# Patient Record
Sex: Female | Born: 1985 | Race: Black or African American | Hispanic: No | Marital: Single | Smoking: Former smoker
Health system: Southern US, Community
[De-identification: ages and names within clinical notes are randomized; demographics above are authoritative.]

## PROBLEM LIST (undated history)

## (undated) ENCOUNTER — Inpatient Hospital Stay (HOSPITAL_COMMUNITY): Payer: Self-pay

## (undated) DIAGNOSIS — Z789 Other specified health status: Secondary | ICD-10-CM

## (undated) HISTORY — PX: UMBILICAL HERNIA REPAIR: SHX196

## (undated) HISTORY — PX: I & D EXTREMITY: SHX5045

---
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
## 2005-01-14 ENCOUNTER — Ambulatory Visit: Payer: Self-pay | Admitting: Family Medicine

## 2005-03-28 ENCOUNTER — Encounter (INDEPENDENT_AMBULATORY_CARE_PROVIDER_SITE_OTHER): Payer: Self-pay | Admitting: *Deleted

## 2005-04-12 ENCOUNTER — Ambulatory Visit: Payer: Self-pay | Admitting: Sports Medicine

## 2005-04-16 ENCOUNTER — Encounter: Admission: RE | Admit: 2005-04-16 | Discharge: 2005-04-16 | Payer: Self-pay | Admitting: Sports Medicine

## 2005-05-01 ENCOUNTER — Emergency Department (HOSPITAL_COMMUNITY): Admission: EM | Admit: 2005-05-01 | Discharge: 2005-05-01 | Payer: Self-pay | Admitting: *Deleted

## 2005-05-17 ENCOUNTER — Ambulatory Visit: Payer: Self-pay | Admitting: Family Medicine

## 2005-07-01 ENCOUNTER — Emergency Department (HOSPITAL_COMMUNITY): Admission: EM | Admit: 2005-07-01 | Discharge: 2005-07-01 | Payer: Self-pay | Admitting: Emergency Medicine

## 2005-07-19 ENCOUNTER — Ambulatory Visit: Payer: Self-pay | Admitting: Family Medicine

## 2005-07-20 ENCOUNTER — Emergency Department (HOSPITAL_COMMUNITY): Admission: EM | Admit: 2005-07-20 | Discharge: 2005-07-20 | Payer: Self-pay | Admitting: Emergency Medicine

## 2005-10-16 ENCOUNTER — Ambulatory Visit: Payer: Self-pay | Admitting: Family Medicine

## 2005-10-31 ENCOUNTER — Ambulatory Visit: Payer: Self-pay | Admitting: Family Medicine

## 2005-12-02 ENCOUNTER — Emergency Department (HOSPITAL_COMMUNITY): Admission: EM | Admit: 2005-12-02 | Discharge: 2005-12-03 | Payer: Self-pay | Admitting: Emergency Medicine

## 2006-02-25 ENCOUNTER — Ambulatory Visit: Payer: Self-pay

## 2006-03-27 DIAGNOSIS — F339 Major depressive disorder, recurrent, unspecified: Secondary | ICD-10-CM | POA: Insufficient documentation

## 2006-03-28 ENCOUNTER — Encounter (INDEPENDENT_AMBULATORY_CARE_PROVIDER_SITE_OTHER): Payer: Self-pay | Admitting: *Deleted

## 2006-05-14 ENCOUNTER — Ambulatory Visit: Payer: Self-pay | Admitting: Family Medicine

## 2006-05-16 ENCOUNTER — Ambulatory Visit: Payer: Self-pay | Admitting: Family Medicine

## 2006-05-27 ENCOUNTER — Encounter (INDEPENDENT_AMBULATORY_CARE_PROVIDER_SITE_OTHER): Payer: Self-pay | Admitting: Family Medicine

## 2006-05-27 ENCOUNTER — Other Ambulatory Visit: Admission: RE | Admit: 2006-05-27 | Discharge: 2006-05-27 | Payer: Self-pay | Admitting: Family Medicine

## 2006-05-27 ENCOUNTER — Ambulatory Visit: Payer: Self-pay | Admitting: Family Medicine

## 2006-05-27 LAB — CONVERTED CEMR LAB: Chlamydia, DNA Probe: NEGATIVE

## 2006-05-28 ENCOUNTER — Encounter (INDEPENDENT_AMBULATORY_CARE_PROVIDER_SITE_OTHER): Payer: Self-pay | Admitting: Family Medicine

## 2006-06-02 ENCOUNTER — Encounter (INDEPENDENT_AMBULATORY_CARE_PROVIDER_SITE_OTHER): Payer: Self-pay | Admitting: Family Medicine

## 2006-06-11 ENCOUNTER — Encounter (INDEPENDENT_AMBULATORY_CARE_PROVIDER_SITE_OTHER): Payer: Self-pay | Admitting: Family Medicine

## 2006-06-11 LAB — CONVERTED CEMR LAB: Pap Smear: NORMAL

## 2006-10-10 ENCOUNTER — Ambulatory Visit: Payer: Self-pay | Admitting: Family Medicine

## 2006-10-23 ENCOUNTER — Ambulatory Visit: Payer: Self-pay | Admitting: Sports Medicine

## 2006-10-23 LAB — CONVERTED CEMR LAB: Beta hcg, urine, semiquantitative: NEGATIVE

## 2006-12-02 ENCOUNTER — Encounter: Payer: Self-pay | Admitting: *Deleted

## 2006-12-19 ENCOUNTER — Ambulatory Visit: Payer: Self-pay | Admitting: Family Medicine

## 2006-12-19 ENCOUNTER — Encounter (INDEPENDENT_AMBULATORY_CARE_PROVIDER_SITE_OTHER): Payer: Self-pay | Admitting: Family Medicine

## 2006-12-19 LAB — CONVERTED CEMR LAB
Cholesterol: 99 mg/dL (ref 0–200)
Triglycerides: 50 mg/dL (ref <150)
VLDL: 10 mg/dL (ref 0–40)

## 2007-01-02 ENCOUNTER — Encounter (INDEPENDENT_AMBULATORY_CARE_PROVIDER_SITE_OTHER): Payer: Self-pay | Admitting: Family Medicine

## 2007-01-15 ENCOUNTER — Ambulatory Visit: Payer: Self-pay | Admitting: Family Medicine

## 2007-01-15 LAB — CONVERTED CEMR LAB: Beta hcg, urine, semiquantitative: NEGATIVE

## 2007-01-30 ENCOUNTER — Ambulatory Visit: Payer: Self-pay | Admitting: Family Medicine

## 2007-02-06 ENCOUNTER — Encounter (INDEPENDENT_AMBULATORY_CARE_PROVIDER_SITE_OTHER): Payer: Self-pay | Admitting: Family Medicine

## 2007-02-06 ENCOUNTER — Ambulatory Visit: Payer: Self-pay | Admitting: Family Medicine

## 2007-02-06 LAB — CONVERTED CEMR LAB
GC Probe Amp, Genital: NEGATIVE
TSH: 1.282 microintl units/mL (ref 0.350–5.50)

## 2007-02-09 ENCOUNTER — Encounter (INDEPENDENT_AMBULATORY_CARE_PROVIDER_SITE_OTHER): Payer: Self-pay | Admitting: Family Medicine

## 2007-02-11 ENCOUNTER — Encounter (INDEPENDENT_AMBULATORY_CARE_PROVIDER_SITE_OTHER): Payer: Self-pay | Admitting: Family Medicine

## 2007-02-16 ENCOUNTER — Ambulatory Visit: Payer: Self-pay | Admitting: Family Medicine

## 2007-04-17 ENCOUNTER — Ambulatory Visit: Payer: Self-pay | Admitting: Family Medicine

## 2007-05-19 ENCOUNTER — Ambulatory Visit: Payer: Self-pay | Admitting: Family Medicine

## 2007-06-01 ENCOUNTER — Telehealth: Payer: Self-pay | Admitting: *Deleted

## 2007-07-16 ENCOUNTER — Ambulatory Visit: Payer: Self-pay | Admitting: Family Medicine

## 2007-09-24 ENCOUNTER — Ambulatory Visit: Payer: Self-pay | Admitting: Family Medicine

## 2007-09-29 ENCOUNTER — Ambulatory Visit: Payer: Self-pay | Admitting: Family Medicine

## 2007-10-01 ENCOUNTER — Ambulatory Visit: Payer: Self-pay | Admitting: Family Medicine

## 2007-12-14 ENCOUNTER — Telehealth (INDEPENDENT_AMBULATORY_CARE_PROVIDER_SITE_OTHER): Payer: Self-pay | Admitting: *Deleted

## 2008-01-11 ENCOUNTER — Telehealth: Payer: Self-pay | Admitting: *Deleted

## 2008-01-13 ENCOUNTER — Ambulatory Visit: Payer: Self-pay | Admitting: Family Medicine

## 2008-01-13 LAB — CONVERTED CEMR LAB: Beta hcg, urine, semiquantitative: NEGATIVE

## 2008-03-25 ENCOUNTER — Telehealth (INDEPENDENT_AMBULATORY_CARE_PROVIDER_SITE_OTHER): Payer: Self-pay | Admitting: *Deleted

## 2008-04-08 ENCOUNTER — Ambulatory Visit: Payer: Self-pay | Admitting: Family Medicine

## 2008-06-24 ENCOUNTER — Ambulatory Visit: Payer: Self-pay | Admitting: Family Medicine

## 2008-09-22 ENCOUNTER — Ambulatory Visit: Payer: Self-pay | Admitting: Family Medicine

## 2008-11-01 ENCOUNTER — Emergency Department (HOSPITAL_COMMUNITY): Admission: EM | Admit: 2008-11-01 | Discharge: 2008-11-02 | Payer: Self-pay | Admitting: Emergency Medicine

## 2008-11-18 ENCOUNTER — Encounter: Payer: Self-pay | Admitting: Family Medicine

## 2008-11-18 ENCOUNTER — Ambulatory Visit: Payer: Self-pay | Admitting: Family Medicine

## 2008-11-18 LAB — CONVERTED CEMR LAB
Chlamydia, DNA Probe: NEGATIVE
GC Probe Amp, Genital: NEGATIVE

## 2008-11-22 ENCOUNTER — Encounter: Payer: Self-pay | Admitting: Family Medicine

## 2008-12-10 ENCOUNTER — Encounter (INDEPENDENT_AMBULATORY_CARE_PROVIDER_SITE_OTHER): Payer: Self-pay | Admitting: *Deleted

## 2008-12-10 DIAGNOSIS — F17200 Nicotine dependence, unspecified, uncomplicated: Secondary | ICD-10-CM

## 2008-12-26 ENCOUNTER — Ambulatory Visit: Payer: Self-pay | Admitting: Family Medicine

## 2008-12-26 LAB — CONVERTED CEMR LAB: Beta hcg, urine, semiquantitative: NEGATIVE

## 2009-01-09 ENCOUNTER — Telehealth: Payer: Self-pay | Admitting: Family Medicine

## 2009-01-10 ENCOUNTER — Encounter: Payer: Self-pay | Admitting: Family Medicine

## 2009-01-18 ENCOUNTER — Encounter: Payer: Self-pay | Admitting: Family Medicine

## 2009-03-21 ENCOUNTER — Emergency Department (HOSPITAL_COMMUNITY): Admission: EM | Admit: 2009-03-21 | Discharge: 2009-03-21 | Payer: Self-pay | Admitting: Emergency Medicine

## 2009-04-07 ENCOUNTER — Ambulatory Visit: Payer: Self-pay | Admitting: Family Medicine

## 2009-04-07 LAB — CONVERTED CEMR LAB: Beta hcg, urine, semiquantitative: NEGATIVE

## 2009-06-01 ENCOUNTER — Ambulatory Visit (HOSPITAL_COMMUNITY): Admission: RE | Admit: 2009-06-01 | Discharge: 2009-06-01 | Payer: Self-pay | Admitting: Family Medicine

## 2009-06-01 ENCOUNTER — Ambulatory Visit: Payer: Self-pay | Admitting: Family Medicine

## 2009-06-01 ENCOUNTER — Encounter: Payer: Self-pay | Admitting: Family Medicine

## 2009-06-01 LAB — CONVERTED CEMR LAB
Basophils Absolute: 0 10*3/uL (ref 0.0–0.1)
Eosinophils Absolute: 0.2 10*3/uL (ref 0.0–0.7)
Lymphs Abs: 2.9 10*3/uL (ref 0.7–4.0)
MCV: 85.2 fL (ref 78.0–100.0)
Neutro Abs: 4.8 10*3/uL (ref 1.7–7.7)
Neutrophils Relative %: 57 % (ref 43–77)

## 2009-06-02 ENCOUNTER — Encounter: Payer: Self-pay | Admitting: Family Medicine

## 2009-06-08 ENCOUNTER — Telehealth: Payer: Self-pay | Admitting: *Deleted

## 2009-10-31 ENCOUNTER — Ambulatory Visit: Payer: Self-pay | Admitting: Family Medicine

## 2009-10-31 LAB — CONVERTED CEMR LAB: Beta hcg, urine, semiquantitative: NEGATIVE

## 2009-11-10 ENCOUNTER — Encounter: Payer: Self-pay | Admitting: Family Medicine

## 2009-11-10 ENCOUNTER — Ambulatory Visit: Payer: Self-pay | Admitting: Family Medicine

## 2009-11-10 LAB — CONVERTED CEMR LAB
GC Probe Amp, Genital: NEGATIVE
Whiff Test: NEGATIVE

## 2009-11-29 ENCOUNTER — Emergency Department (HOSPITAL_COMMUNITY)
Admission: EM | Admit: 2009-11-29 | Discharge: 2009-11-29 | Payer: Self-pay | Source: Home / Self Care | Admitting: Emergency Medicine

## 2009-11-30 ENCOUNTER — Telehealth (INDEPENDENT_AMBULATORY_CARE_PROVIDER_SITE_OTHER): Payer: Self-pay | Admitting: *Deleted

## 2009-12-01 ENCOUNTER — Ambulatory Visit: Payer: Self-pay | Admitting: Family Medicine

## 2009-12-01 DIAGNOSIS — R091 Pleurisy: Secondary | ICD-10-CM | POA: Insufficient documentation

## 2009-12-06 ENCOUNTER — Ambulatory Visit: Payer: Self-pay | Admitting: Family Medicine

## 2009-12-06 DIAGNOSIS — R918 Other nonspecific abnormal finding of lung field: Secondary | ICD-10-CM

## 2009-12-08 ENCOUNTER — Ambulatory Visit: Payer: Self-pay | Admitting: Family Medicine

## 2009-12-29 ENCOUNTER — Encounter (INDEPENDENT_AMBULATORY_CARE_PROVIDER_SITE_OTHER): Payer: Self-pay | Admitting: *Deleted

## 2010-01-25 ENCOUNTER — Ambulatory Visit: Admission: RE | Admit: 2010-01-25 | Discharge: 2010-01-25 | Payer: Self-pay | Source: Home / Self Care
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
## 2010-03-01 NOTE — Progress Notes (Signed)
Summary: x-ray order/TS  Phone Note Outgoing Call Call back at [facility] Phone [phone]   Summary of Call: called to discuss results of EKG and normal CBC/TSH.  pt states notes relationship between exertion and eating and chest pain sxs.  Advised I'd like to get CXR to eval heart pathology/hiatal hernia.  Concerning is family history.  Consider cards referral.  could first consider PPI vs NSAID. Initial call taken by: Eustaquio Boyden  MD,  Jun 08, 2009 4:45 PM  Follow-up for Phone Call        order for CXR up front for pick up. Follow-up by: Arlyss Repress CMA,,  Jun 08, 2009 5:28 PM

## 2010-03-01 NOTE — Assessment & Plan Note (Signed)
Summary: depo inj,tcb  Nurse Visit   Allergies: No Known Drug Allergies Laboratory Results   Urine Tests  Date/Time Received: April 07, 2009 2:48 PM  Date/Time Reported: April 07, 2009 2:52 PM     Urine HCG: negative Comments: ...........test performed by...........Marland KitchenTerese Door, CMA     Medication Administration  Injection # 1:    Medication: Depo-Provera 150mg     Diagnosis: CONTRACEPTIVE MANAGEMENT (ICD-V25.09)    Route: IM    Site: R deltoid    Exp Date: 05/2011    Lot #: [id number]    Mfr: [email or web address]    Comments: next depo due May 27 thru June 10    Patient tolerated injection without complications    Given by: Theresia Lo RN (April 07, 2009 3:13 PM)  Orders Added: 1)  U Preg-[facility] [81025] 2)  Depo-Provera 150mg  [J1055] 3)  Admin of Injection (IM/SQ) [82993]    Medication Administration  Injection # 1:    Medication: Depo-Provera 150mg     Diagnosis: CONTRACEPTIVE MANAGEMENT (ICD-V25.09)    Route: IM    Site: R deltoid    Exp Date: 05/2011    Lot #: [id number]    Mfr: [email or web address]    Comments: next depo due May 27 thru June 10    Patient tolerated injection without complications    Given by: Theresia Lo RN (April 07, 2009 3:13 PM)  Orders Added: 1)  U Preg-[facility] [81025] 2)  Depo-Provera 150mg  [J1055] 3)  Admin of Injection (IM/SQ) [89381]  patient instructed to use extra protection for next 7 days.  she reports no sexual relations in past 2 weeks. Theresia Lo RN  April 07, 2009 3:15 PM

## 2010-03-01 NOTE — Assessment & Plan Note (Signed)
Summary: depo/eo  Nurse Visit   Vitals Entered By: Arlyss Repress CMA, (January 25, 2010 10:05 AM)  Allergies: No Known Drug Allergies  Medication Administration  Injection # 1:    Medication: Depo-Provera 150mg     Diagnosis: CONTRACEPTIVE MANAGEMENT (ICD-V25.09)    Route: IM    Site: RUOQ gluteus    Exp Date: 05/28/2012    Lot #: [id number]    Mfr: [email or web address]    Comments: NEXT DEPO DUE 3-16 THROUGH 04-27-2010. REMINDER CARD GIVEN TO PT.    Patient tolerated injection without complications    Given by: Arlyss Repress CMA, (January 25, 2010 10:06 AM)  Orders Added: 1)  Depo-Provera 150mg  [J1055] 2)  Admin of Injection (IM/SQ) [62952]   Medication Administration  Injection # 1:    Medication: Depo-Provera 150mg     Diagnosis: CONTRACEPTIVE MANAGEMENT (ICD-V25.09)    Route: IM    Site: RUOQ gluteus    Exp Date: 05/28/2012    Lot #: [id number]    Mfr: [email or web address]    Comments: NEXT DEPO DUE 3-16 THROUGH 04-27-2010. REMINDER CARD GIVEN TO PT.    Patient tolerated injection without complications    Given by: Arlyss Repress CMA, (January 25, 2010 10:06 AM)  Orders Added: 1)  Depo-Provera 150mg  [J1055] 2)  Admin of Injection (IM/SQ) [40102]
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
## 2010-03-01 NOTE — Assessment & Plan Note (Signed)
Summary: f/u,df   Vital Signs:  Patient profile:   25 year old female Weight:      99.3 pounds Pulse rate:   74 / minute BP sitting:   112 / 74  (right arm)  Vitals Entered By: Renato Battles slade,cma CC: note for insurance, that she needs depo shot for heavy menses. c/o chest pain every few days. feels like someone sticks a pin into my chest. has been going on for over the last 6 months. Is Patient Diabetic? No Pain Assessment Patient in pain? no        Primary Care Provider:  Eustaquio Boyden  MD  CC:  note for insurance and that she needs depo shot for heavy menses. c/o chest pain every few days. feels like someone sticks a pin into my chest. has been going on for over the last 6 months..  History of Present Illness: CC: note for insurance  1. letter verifying depo is for menorrhagia.  Depo is only thing that helps.  Has been on depo for several years now.  Doesn't drink milk, no calcium supplements.  does eat cheese.  2. chest pain - 6 mo h/o sharp chest pains about once a month, happen slightly left sided.  Lasts seconds then goes away.  No relation to activity, or food.  h/o HLD.  Last week occurred when rushing and studying.  Increased sterss recently - school and Mom with lung cancer.  never tight pressure, never dull achey.  No radiation.  Hasn't tried meds for it.  Brother with h/o MI at age 37yo and 13yo.   Not reproducible.  Occasional palpitations, dizziness x 1 episode and dyspnea on exertion.  No leg swelling.  3. Smoker - 5 cig/day.  attributes to stress.    Habits & Providers  Alcohol-Tobacco-Diet     Tobacco Status: current     Tobacco Counseling: to quit use of tobacco products  Current Medications (verified): 1)  Depo-Provera 150 Mg/ml Susp (Medroxyprogesterone Acetate) .... Inject 1 Syringe Intramuscularly Single Dose 2)  Cyclobenzaprine Hcl 5 Mg Tabs (Cyclobenzaprine Hcl) .... Take One By Mouth Two Times A Day As Needed Spasm  Allergies (verified): No Known  Drug Allergies  Family History: 4 bros, 1 sis healthy;another brother had MI at age 58 and again at age 62.  mom with depression and lung CA; dad with HTN. grnadma died cancer.  Physical Exam  General:  Well-developed,well-nourished,in no acute distress; alert,appropriate and cooperative throughout examination.  thin Chest Wall:  No deformities, masses, or tenderness noted. Lungs:  Normal respiratory effort, chest expands symmetrically. Lungs are clear to auscultation, no crackles or wheezes. Heart:  Normal rate and regular rhythm. S1 and S2 normal without gallop, murmur, click, rub or other extra sounds. Pulses:  2+ periph pulses Extremities:  No clubbing, cyanosis, edema, or deformity noted with normal full range of motion of all joints.     Impression & Recommendations:  Problem # 1:  CHEST PAIN UNSPECIFIED (ICD-786.50) pt left before I could finish with her.  Would like her to return in 1-2 wks for f/u, would like to have cxr to further evaluate picture, eval for hiatal hernia.  check TSH and CBC.  concerning is family history of MI at early age although sxs don't sound cardiac in nature.  would try trial of PPI vs NSAID for msk pain.  EKG - normal to RAD, NSR, t wave inverted lead V2.   No WPW or arrhythmia.  Orders: 12 Lead EKG (12 Lead  EKG) [facility]- Est  Level 4 (99214) CBC w/Diff-[facility] (16109) TSH-[facility] (60454-09811)  Problem # 2:  TOBACCO USER (ICD-305.1) encouraged cessation Orders: 12 Lead EKG (12 Lead EKG) [facility]- Est  Level 4 (91478)  Problem # 3:  CONTRACEPTIVE MANAGEMENT (ICD-V25.09)  continue depo shot.  provided with letter.  Orders: [facility]- Est  Level 4 (99214)  Complete Medication List: 1)  Depo-provera 150 Mg/ml Susp (Medroxyprogesterone acetate) .... Inject 1 syringe intramuscularly single dose 2)  Cyclobenzaprine Hcl 5 Mg Tabs (Cyclobenzaprine hcl) .... Take one by mouth two times a day as needed spasm  Patient Instructions: 1)  Your EKG is looking good. 2)  I'm  unsure what is causing your chest pain.

## 2010-03-01 NOTE — Progress Notes (Signed)
Summary: triage  Phone Note Call from Patient Call back at Home Phone [phone]   Caller: Patient Summary of Call: pt is on depo and having abd pain and spotting pt has appt next Wed w/ Ashley Royalty Initial call taken by: De Nurse,  November 30, 2009 3:51 PM  Follow-up for Phone Call        spoke with patient and she states she developed chest pain yesterday and was taken to the ER. she was diagnosed with a lung infection she states.  she was also having abdominal pain at that time. she noticed some vaginal spotting today one time. she has ben on depo for a while . advised not uncommon for this this to occur . advised if bleeding should become heavy  we need to see sooner but otherwise she will follow up with MD at appointment next week. Follow-up by: Theresia Lo RN,  November 30, 2009 4:27 PM     Appended Document: triage patient calls back and decided she does want to be seen tomorrow. appointment scheduled tomorrow. AM at 8:30 AM.

## 2010-03-01 NOTE — Letter (Signed)
Summary: Generic Letter  [facility]  [address]   [place], [place] [address]   Phone: [phone]  Fax: [phone]    06/01/2009  [facility] [address] [place], [place]  [address]  Dear Ms. Odekirk,   To whom it may concern;  Meghan Frederick takes Depo provera for regulation of her menses.  This is clinically indicated.   Please contact my office if you have further questions.    Sincerely,   Eustaquio Boyden  MD

## 2010-03-01 NOTE — Assessment & Plan Note (Signed)
Summary: depo,df  Nurse Visit   Allergies: No Known Drug Allergies Laboratory Results   Urine Tests  Date/Time Received: October 31, 2009 9:06 AM  Date/Time Reported: October 31, 2009 9:11 AM     Urine HCG: negative Comments: ...............test performed by......Marland KitchenBonnie A. [place], MLS (ASCP)cm     Medication Administration  Injection # 1:    Medication: Depo-Provera 150mg     Diagnosis: CONTRACEPTIVE MANAGEMENT (ICD-V25.09)    Route: IM    Site: L deltoid    Exp Date: 03/2012    Lot #: [id number]    Mfr: [email or web address]    Comments: next depo due Dec 20 thru Jan 30, 2010    Patient tolerated injection without complications    Given by: Theresia Lo RN (October 31, 2009 9:58 AM)  Orders Added: 1)  U Preg-[facility] [81025] 2)  Depo-Provera 150mg  [J1055] 3)  Admin of Injection (IM/SQ) [53664]    Medication Administration  Injection # 1:    Medication: Depo-Provera 150mg     Diagnosis: CONTRACEPTIVE MANAGEMENT (ICD-V25.09)    Route: IM    Site: L deltoid    Exp Date: 03/2012    Lot #: [id number]    Mfr: [email or web address]    Comments: next depo due Dec 20 thru Jan 30, 2010    Patient tolerated injection without complications    Given by: Theresia Lo RN (October 31, 2009 9:58 AM)  Orders Added: 1)  U Preg-[facility] [81025] 2)  Depo-Provera 150mg  [J1055] 3)  Admin of Injection (IM/SQ) [25956]  patient reports no sexual activity  for more than 2 weeks . advised to use extra protection for next 7 days. she has appointment scheduled for CPE Nov 10, 2009. Theresia Lo RN  October 31, 2009 10:00 AM
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
## 2010-03-01 NOTE — Assessment & Plan Note (Signed)
Summary: read tb/eo  Nurse Visit   Allergies: No Known Drug Allergies  PPD Results    Date of reading: 12/08/2009    Results: 0 mm    Interpretation: negative  Orders Added: 1)  No Charge Patient Arrived ([id number]) [[id number]]

## 2010-03-01 NOTE — Miscellaneous (Signed)
  Clinical Lists Changes Medical Records Request Received medical records request Records went to [facility] Faxed on 12/06/2008 [facility]her  December 29, 2009 11:27 AM

## 2010-03-05 ENCOUNTER — Encounter: Payer: Self-pay | Admitting: *Deleted

## 2010-04-10 LAB — POCT PREGNANCY, URINE: Preg Test, Ur: NEGATIVE

## 2010-04-10 LAB — COMPREHENSIVE METABOLIC PANEL WITH GFR
ALT: 8 U/L (ref 0–35)
AST: 18 U/L (ref 0–37)
Albumin: 3.8 g/dL (ref 3.5–5.2)
Alkaline Phosphatase: 59 U/L (ref 39–117)
BUN: 6 mg/dL (ref 6–23)
CO2: 25 meq/L (ref 19–32)
Calcium: 9 mg/dL (ref 8.4–10.5)
Chloride: 106 meq/L (ref 96–112)
Creatinine, Ser: 0.56 mg/dL (ref 0.4–1.2)
GFR calc non Af Amer: >60 mL/min
Glucose, Bld: 95 mg/dL (ref 70–99)
Potassium: 3.7 meq/L (ref 3.5–5.1)
Sodium: 138 meq/L (ref 135–145)
Total Bilirubin: 0.6 mg/dL (ref 0.3–1.2)
Total Protein: 7.2 g/dL (ref 6.0–8.3)

## 2010-04-10 LAB — POCT CARDIAC MARKERS

## 2010-04-10 LAB — D-DIMER, QUANTITATIVE

## 2010-04-18 LAB — URINALYSIS, ROUTINE W REFLEX MICROSCOPIC
Hgb urine dipstick: NEGATIVE
Ketones, ur: NEGATIVE mg/dL
Nitrite: NEGATIVE
Protein, ur: NEGATIVE mg/dL
Urobilinogen, UA: 1 mg/dL (ref 0.0–1.0)

## 2010-04-18 LAB — URINE MICROSCOPIC-ADD ON

## 2010-05-18 ENCOUNTER — Ambulatory Visit (INDEPENDENT_AMBULATORY_CARE_PROVIDER_SITE_OTHER): Payer: Medicaid Other | Admitting: *Deleted

## 2010-05-18 DIAGNOSIS — Z309 Encounter for contraceptive management, unspecified: Secondary | ICD-10-CM

## 2010-05-18 DIAGNOSIS — Z3049 Encounter for surveillance of other contraceptives: Secondary | ICD-10-CM

## 2010-05-18 MED ORDER — MEDROXYPROGESTERONE ACETATE 150 MG/ML IM SUSP: 150.0000 mg | INTRAMUSCULAR | Status: DC

## 2010-05-18 MED ORDER — MEDROXYPROGESTERONE ACETATE 150 MG/ML IM SUSP
150.0000 mg | Freq: Once | INTRAMUSCULAR | Status: AC
  Administered 2010-05-18: 150 mg via INTRAMUSCULAR

## 2010-05-18 NOTE — Progress Notes (Signed)
Patient reports no sexual activity for greater than 2 weeks.  Depo given today and reminder card given to return  July 6 thru August 17, 2010

## 2010-07-30 ENCOUNTER — Ambulatory Visit (INDEPENDENT_AMBULATORY_CARE_PROVIDER_SITE_OTHER): Payer: Medicaid Other | Admitting: *Deleted

## 2010-07-30 DIAGNOSIS — Z309 Encounter for contraceptive management, unspecified: Secondary | ICD-10-CM

## 2010-07-30 NOTE — Progress Notes (Signed)
Patient arrived but is too early for depo. Advised to return 07/06 thru August 17, 2010 for next depo

## 2010-08-10 ENCOUNTER — Ambulatory Visit (INDEPENDENT_AMBULATORY_CARE_PROVIDER_SITE_OTHER): Payer: Medicaid Other | Admitting: *Deleted

## 2010-08-10 DIAGNOSIS — Z309 Encounter for contraceptive management, unspecified: Secondary | ICD-10-CM

## 2010-08-10 DIAGNOSIS — Z3049 Encounter for surveillance of other contraceptives: Secondary | ICD-10-CM

## 2010-08-10 MED ORDER — MEDROXYPROGESTERONE ACETATE 150 MG/ML IM SUSP
150.0000 mg | Freq: Once | INTRAMUSCULAR | Status: AC
  Administered 2010-08-10: 150 mg via INTRAMUSCULAR

## 2010-08-10 NOTE — Progress Notes (Deleted)
I

## 2010-10-29 ENCOUNTER — Ambulatory Visit (INDEPENDENT_AMBULATORY_CARE_PROVIDER_SITE_OTHER): Payer: Medicaid Other | Admitting: *Deleted

## 2010-10-29 DIAGNOSIS — Z309 Encounter for contraceptive management, unspecified: Secondary | ICD-10-CM

## 2010-10-29 DIAGNOSIS — Z3049 Encounter for surveillance of other contraceptives: Secondary | ICD-10-CM

## 2010-10-29 MED ORDER — MEDROXYPROGESTERONE ACETATE 150 MG/ML IM SUSP
150.0000 mg | Freq: Once | INTRAMUSCULAR | Status: AC
  Administered 2010-10-29: 150 mg via INTRAMUSCULAR

## 2010-11-16 ENCOUNTER — Encounter: Payer: Medicaid Other | Admitting: Family Medicine

## 2011-01-14 ENCOUNTER — Ambulatory Visit (INDEPENDENT_AMBULATORY_CARE_PROVIDER_SITE_OTHER): Payer: Medicaid Other | Admitting: Family Medicine

## 2011-01-14 ENCOUNTER — Encounter: Payer: Self-pay | Admitting: Family Medicine

## 2011-01-14 ENCOUNTER — Other Ambulatory Visit (HOSPITAL_COMMUNITY)
Admission: RE | Admit: 2011-01-14 | Discharge: 2011-01-14 | Disposition: A | Discharge status: Home / Self Care | Payer: Medicaid Other | Source: Ambulatory Visit | Attending: Family Medicine | Admitting: Family Medicine

## 2011-01-14 DIAGNOSIS — Z124 Encounter for screening for malignant neoplasm of cervix: Secondary | ICD-10-CM

## 2011-01-14 DIAGNOSIS — Z309 Encounter for contraceptive management, unspecified: Secondary | ICD-10-CM

## 2011-01-14 DIAGNOSIS — Z01419 Encounter for gynecological examination (general) (routine) without abnormal findings: Secondary | ICD-10-CM

## 2011-01-14 DIAGNOSIS — N760 Acute vaginitis: Secondary | ICD-10-CM

## 2011-01-14 DIAGNOSIS — IMO0001 Reserved for inherently not codable concepts without codable children: Secondary | ICD-10-CM

## 2011-01-14 LAB — POCT WET PREP (WET MOUNT)

## 2011-01-14 MED ORDER — MEDROXYPROGESTERONE ACETATE 150 MG/ML IM SUSP
150.0000 mg | Freq: Once | INTRAMUSCULAR | Status: AC
  Administered 2011-01-14: 150 mg via INTRAMUSCULAR

## 2011-01-14 NOTE — Patient Instructions (Signed)
It was nice seeing you today.   I will let you know the results of your tests when they return. Return in 3 months for repeat Depo Shot

## 2011-01-16 NOTE — Progress Notes (Signed)
Patient ID: Meghan Frederick, female   DOB: Apr 14, 1985, 25 y.o.   MRN: [id number] SUBJECTIVE:  25 y.o. female for annual routine Pap and checkup. Current Outpatient Prescriptions  Medication Sig Dispense Refill  . cyclobenzaprine (FLEXERIL) 5 MG tablet Take 5 mg by mouth 2 (two) times daily as needed. spasm       . medroxyPROGESTERone (DEPO-PROVERA) 150 MG/ML injection Inject 1 mL (150 mg total) into the muscle every 3 (three) months. Inject 1 syringe intramuscularly single dose  1 mL     Allergies: Review of patient's allergies indicates no known allergies.  Patient's last menstrual period was 12/24/2010., despite being on Depo.  She feels like depo was not administered correctly last time.  Denies any chance of being pregnant, abstinent x2 years  ROS:  Feeling well. No dyspnea or chest pain on exertion.  No abdominal pain, change in bowel habits, black or bloody stools.  No urinary tract symptoms. GYN ROS: she complains of vaginal discharge, clear in color without odor. No itching or burning.  No neurological complaints.  OBJECTIVE:  The patient appears well, alert, oriented x 3, in no distress. BP 108/70  Pulse 73  Temp(Src) 98.3 F (36.8 C) (Oral)  Ht 5\' 4"  (1.626 m)  Wt 96 lb (43.545 kg)  BMI 16.48 kg/m2  LMP 12/24/2010 ENT normal.  Neck supple. No adenopathy or thyromegaly. PERLA. Lungs are clear, good air entry, no wheezes, rhonchi or rales. S1 and S2 normal, no murmurs, regular rate and rhythm. Abdomen soft without tenderness, guarding, mass or organomegaly. Extremities show no edema, normal peripheral pulses. Neurological is normal, no focal findings.  PELVIC EXAM: normal external genitalia, vulva, vagina, cervix, uterus and adnexa, VULVA: normal appearing vulva with no masses, tenderness or lesions, VAGINA: normal appearing vagina with normal color and discharge, no lesions, CERVIX: normal appearing cervix without discharge or lesions, UTERUS: uterus is normal size, shape, consistency  and nontender, ADNEXA: normal adnexa in size, nontender and no masses, PAP: Pap smear done today, WET MOUNT done -  exam chaperoned by Arlyss Repress  ASSESSMENT:  well woman  PLAN:  pap smear counseled on breast self exam, STD prevention and HIV risk factors and prevention return annually or prn Depo Provera

## 2011-02-13 ENCOUNTER — Encounter: Payer: Self-pay | Admitting: Family Medicine

## 2011-04-04 ENCOUNTER — Ambulatory Visit (INDEPENDENT_AMBULATORY_CARE_PROVIDER_SITE_OTHER): Payer: Medicaid Other | Admitting: *Deleted

## 2011-04-04 DIAGNOSIS — Z309 Encounter for contraceptive management, unspecified: Secondary | ICD-10-CM

## 2011-04-04 DIAGNOSIS — Z3049 Encounter for surveillance of other contraceptives: Secondary | ICD-10-CM

## 2011-04-04 MED ORDER — MEDROXYPROGESTERONE ACETATE 150 MG/ML IM SUSP
150.0000 mg | Freq: Once | INTRAMUSCULAR | Status: AC
  Administered 2011-04-04: 150 mg via INTRAMUSCULAR

## 2011-04-04 NOTE — Progress Notes (Signed)
Next Depo due May 23 thru July 04, 2011.

## 2011-07-10 ENCOUNTER — Ambulatory Visit (INDEPENDENT_AMBULATORY_CARE_PROVIDER_SITE_OTHER): Payer: Medicaid Other | Admitting: *Deleted

## 2011-07-10 DIAGNOSIS — Z309 Encounter for contraceptive management, unspecified: Secondary | ICD-10-CM

## 2011-07-10 LAB — POCT URINE PREGNANCY: Preg Test, Ur: NEGATIVE

## 2011-07-10 MED ORDER — MEDROXYPROGESTERONE ACETATE 150 MG/ML IM SUSP
150.0000 mg | Freq: Once | INTRAMUSCULAR | Status: AC
  Administered 2011-07-10: 150 mg via INTRAMUSCULAR

## 2011-09-06 ENCOUNTER — Telehealth: Payer: Self-pay | Admitting: Family Medicine

## 2011-09-06 NOTE — Telephone Encounter (Signed)
Spoke with patient and  She states she started last night with some light bleeding, dark brown spotting. Not flowing heavy  She has been on depo for many years and this has happened a couple of times before. Advised  not uncommon to have some spotting while on depo but I will send the message to Dr. Ashley Royalty for advice.

## 2011-09-06 NOTE — Telephone Encounter (Signed)
Patient is calling because she isn't due to have her next depo until the end of the month but her cycle has come on early and this has happened a time or two before and she wants to know why this is happening.

## 2011-09-09 NOTE — Telephone Encounter (Signed)
It is common to have "breakthrough bleeding" with this type of contraception.  It does not mean the medication is not effective.  It is hard to say why some people have this occasional bleeding and some people do not, but it is not and uncommon thing.

## 2011-10-01 ENCOUNTER — Ambulatory Visit (INDEPENDENT_AMBULATORY_CARE_PROVIDER_SITE_OTHER): Payer: Medicaid Other | Admitting: *Deleted

## 2011-10-01 DIAGNOSIS — Z309 Encounter for contraceptive management, unspecified: Secondary | ICD-10-CM

## 2011-10-01 MED ORDER — MEDROXYPROGESTERONE ACETATE 150 MG/ML IM SUSP
150.0000 mg | Freq: Once | INTRAMUSCULAR | Status: AC
  Administered 2011-10-01: 150 mg via INTRAMUSCULAR

## 2011-12-25 ENCOUNTER — Ambulatory Visit (INDEPENDENT_AMBULATORY_CARE_PROVIDER_SITE_OTHER): Payer: Medicaid Other | Admitting: *Deleted

## 2011-12-25 DIAGNOSIS — Z309 Encounter for contraceptive management, unspecified: Secondary | ICD-10-CM

## 2011-12-25 MED ORDER — MEDROXYPROGESTERONE ACETATE 150 MG/ML IM SUSP
150.0000 mg | Freq: Once | INTRAMUSCULAR | Status: AC
  Administered 2011-12-25: 150 mg via INTRAMUSCULAR

## 2011-12-25 NOTE — Progress Notes (Signed)
Next Depo due Feb 12 thru Mar 25, 2012.

## 2012-03-16 ENCOUNTER — Ambulatory Visit (INDEPENDENT_AMBULATORY_CARE_PROVIDER_SITE_OTHER): Payer: Medicaid Other | Admitting: *Deleted

## 2012-03-16 DIAGNOSIS — Z309 Encounter for contraceptive management, unspecified: Secondary | ICD-10-CM

## 2012-03-17 MED ORDER — MEDROXYPROGESTERONE ACETATE 150 MG/ML IM SUSP
150.0000 mg | Freq: Once | INTRAMUSCULAR | Status: AC
  Administered 2012-03-16: 150 mg via INTRAMUSCULAR

## 2012-03-17 NOTE — Progress Notes (Signed)
Next depo due May 5 through Jun 15, 2012. Appointment scheduled next week with Dr. Ashley Royalty for CPE.

## 2012-03-25 ENCOUNTER — Encounter: Payer: Self-pay | Admitting: Family Medicine

## 2012-03-25 ENCOUNTER — Ambulatory Visit (INDEPENDENT_AMBULATORY_CARE_PROVIDER_SITE_OTHER): Payer: Medicaid Other | Admitting: Family Medicine

## 2012-03-25 VITALS — BP 110/60 | HR 90 | Ht 64.5 in | Wt 102.0 lb

## 2012-03-25 DIAGNOSIS — M222X1 Patellofemoral disorders, right knee: Secondary | ICD-10-CM | POA: Insufficient documentation

## 2012-03-25 DIAGNOSIS — Z0000 Encounter for general adult medical examination without abnormal findings: Secondary | ICD-10-CM | POA: Insufficient documentation

## 2012-03-25 NOTE — Progress Notes (Signed)
Patient ID: SHAKELIA SCRIVNER, female   DOB: 05/01/1985, 27 y.o.   MRN: [id number] SUBJECTIVE:  27 y.o. female for annual routine checkup.  Complains of intermittent knee pain since starting exercise program. Current Outpatient Prescriptions  Medication Sig Dispense Refill  . medroxyPROGESTERone (DEPO-PROVERA) 150 MG/ML injection Inject 1 mL (150 mg total) into the muscle every 3 (three) months. Inject 1 syringe intramuscularly single dose  1 mL     No current facility-administered medications for this visit.   Allergies: Review of patient's allergies indicates no known allergies.  No LMP recorded. Patient has had an injection.  ROS:  Feeling well. No dyspnea or chest pain on exertion.  No abdominal pain, change in bowel habits, black or bloody stools.  No urinary tract symptoms. GYN ROS: normal menses, no abnormal bleeding, pelvic pain or discharge. No neurological complaints.  OBJECTIVE:  The patient appears well, alert, oriented x 3, in no distress. BP 110/60  Pulse 90  Ht 5' 4.5" (1.638 m)  Wt 102 lb (46.267 kg)  BMI 17.24 kg/m2 ENT normal.  Neck supple. No adenopathy or thyromegaly. PERLA. Lungs are clear, good air entry, no wheezes, rhonchi or rales. S1 and S2 normal, no murmurs, regular rate and rhythm. Abdomen soft without tenderness, guarding, mass or organomegaly. Extremities show no edema, normal peripheral pulses.  R knee without effusion. No TTP. Ligamentous structures intact.  She does have decreased VMO bulk. .Neurological is normal, no focal findings.   ASSESSMENT:  well woman  PLAN:  return annually or prn Next pap due in 2015 Discussed knee exercises/rehab for pain-Patellofemoral syndrome

## 2012-03-25 NOTE — Patient Instructions (Addendum)
Thank you for coming in today, it was good to see you Return in May for your depo shot Do the exercises for your knee and use aleve or ibuprofen for pain.

## 2012-06-05 ENCOUNTER — Ambulatory Visit (INDEPENDENT_AMBULATORY_CARE_PROVIDER_SITE_OTHER): Payer: Medicaid Other | Admitting: *Deleted

## 2012-06-05 DIAGNOSIS — Z309 Encounter for contraceptive management, unspecified: Secondary | ICD-10-CM

## 2012-06-05 MED ORDER — MEDROXYPROGESTERONE ACETATE 150 MG/ML IM SUSP
150.0000 mg | Freq: Once | INTRAMUSCULAR | Status: AC
  Administered 2012-06-05: 150 mg via INTRAMUSCULAR

## 2012-06-05 NOTE — Progress Notes (Signed)
Patient here today for Depo Provera injection.  Depo given today.  Next injection due July 25-August 8.  Reminder card given.  Gaylene Brooks, RN

## 2012-09-03 ENCOUNTER — Ambulatory Visit (INDEPENDENT_AMBULATORY_CARE_PROVIDER_SITE_OTHER): Payer: Medicaid Other | Admitting: *Deleted

## 2012-09-03 DIAGNOSIS — Z309 Encounter for contraceptive management, unspecified: Secondary | ICD-10-CM

## 2012-09-03 MED ORDER — MEDROXYPROGESTERONE ACETATE 150 MG/ML IM SUSP
150.0000 mg | Freq: Once | INTRAMUSCULAR | Status: AC
  Administered 2012-09-03: 150 mg via INTRAMUSCULAR

## 2012-09-03 NOTE — Progress Notes (Signed)
Pt here for depo. Depo given LUOQ. Next depo due oct 22-nov 6- reminder card given Wyatt Haste, RN-BSN

## 2012-10-25 ENCOUNTER — Emergency Department (HOSPITAL_COMMUNITY)
Admission: EM | Admit: 2012-10-25 | Discharge: 2012-10-25 | Discharge status: Absent without leave | Payer: Medicaid Other | Source: Home / Self Care

## 2012-11-24 ENCOUNTER — Ambulatory Visit (INDEPENDENT_AMBULATORY_CARE_PROVIDER_SITE_OTHER): Payer: Medicaid Other | Admitting: *Deleted

## 2012-11-24 DIAGNOSIS — Z309 Encounter for contraceptive management, unspecified: Secondary | ICD-10-CM

## 2012-11-24 MED ORDER — MEDROXYPROGESTERONE ACETATE 150 MG/ML IM SUSP
150.0000 mg | Freq: Once | INTRAMUSCULAR | Status: AC
  Administered 2012-11-24: 150 mg via INTRAMUSCULAR

## 2012-11-24 NOTE — Progress Notes (Signed)
Patient here today for Depo Provera injection.  Depo given today RUOQ.  Next injection due Jan. 13-27, 2015 .  Reminder card given.  Gaylene Brooks, RN

## 2012-11-25 ENCOUNTER — Telehealth: Payer: Self-pay | Admitting: Family Medicine

## 2012-11-25 NOTE — Telephone Encounter (Signed)
Pt called because she a depo on 10/28 and she is cramping and she was concerned since this has never happened before. She would like to talk to a nurse. JW

## 2012-11-30 NOTE — Telephone Encounter (Signed)
Tried calling line busy, will try again later.

## 2013-03-02 ENCOUNTER — Ambulatory Visit (INDEPENDENT_AMBULATORY_CARE_PROVIDER_SITE_OTHER): Payer: Medicaid Other | Admitting: *Deleted

## 2013-03-02 VITALS — Wt 113.0 lb

## 2013-03-02 DIAGNOSIS — Z309 Encounter for contraceptive management, unspecified: Secondary | ICD-10-CM

## 2013-03-02 LAB — POCT URINE PREGNANCY: Preg Test, Ur: NEGATIVE

## 2013-03-02 MED ORDER — MEDROXYPROGESTERONE ACETATE 150 MG/ML IM SUSP
150.0000 mg | Freq: Once | INTRAMUSCULAR | Status: AC
  Administered 2013-03-02: 150 mg via INTRAMUSCULAR

## 2013-03-02 NOTE — Progress Notes (Signed)
   Pt in for Depo Provera injection.  Pt is  7 days late for Depo.  Last sexual intercourse was 4 years ago per pt.  Pregnancy test ordered, negative results.  Pt advised she decided to have sex, use a condom or another form of protection for the next 7 days.  Pt annual exam is due end of this month.  Pt stated she will call to schedule appt when her Depro Provera injection is due.  Pt tolerated Depo injection. Depo given Left upper outer quadrant.  Next injection due April 21 - Jun 01, 2013.  Reminder card given. Clovis PuMartin, Tamika L, RN

## 2013-06-04 ENCOUNTER — Ambulatory Visit (INDEPENDENT_AMBULATORY_CARE_PROVIDER_SITE_OTHER): Payer: Medicaid Other | Admitting: *Deleted

## 2013-06-04 DIAGNOSIS — Z3049 Encounter for surveillance of other contraceptives: Secondary | ICD-10-CM

## 2013-06-04 DIAGNOSIS — Z3042 Encounter for surveillance of injectable contraceptive: Secondary | ICD-10-CM

## 2013-06-04 LAB — POCT URINE PREGNANCY: PREG TEST UR: NEGATIVE

## 2013-06-04 MED ORDER — MEDROXYPROGESTERONE ACETATE 150 MG/ML IM SUSP
150.0000 mg | Freq: Once | INTRAMUSCULAR | Status: AC
  Administered 2013-06-04: 150 mg via INTRAMUSCULAR

## 2013-06-04 NOTE — Progress Notes (Signed)
    Pt late for Depo Provera injection.  Pregnancy test order, results were negative.  Pt advised to use another reliable form of protection for 7 days.  Pt tolerated Depo injection. Depo given Right upper outer quadrant.  Next injection due July 24- September 03, 2013.  Reminder card given. Clovis PuMartin, Tamika L, RN

## 2013-08-30 ENCOUNTER — Ambulatory Visit (INDEPENDENT_AMBULATORY_CARE_PROVIDER_SITE_OTHER): Payer: Medicaid Other | Admitting: *Deleted

## 2013-08-30 DIAGNOSIS — Z3042 Encounter for surveillance of injectable contraceptive: Secondary | ICD-10-CM

## 2013-08-30 DIAGNOSIS — Z3049 Encounter for surveillance of other contraceptives: Secondary | ICD-10-CM

## 2013-08-30 MED ORDER — MEDROXYPROGESTERONE ACETATE 150 MG/ML IM SUSP
150.0000 mg | Freq: Once | INTRAMUSCULAR | Status: AC
  Administered 2013-08-30: 150 mg via INTRAMUSCULAR

## 2013-08-30 NOTE — Progress Notes (Signed)
   Pt in for Depo Provera injection.  Pt tolerated Depo injection. Depo given Left upper outer quadrant.  Next injection due Oct 19-Nov 29, 2013. Pt will call to schedule an appt for physical at that time.  Reminder card given. Clovis PuMartin, Dana Dorner L, RN

## 2014-01-23 ENCOUNTER — Emergency Department (HOSPITAL_COMMUNITY): Payer: No Typology Code available for payment source

## 2014-01-23 ENCOUNTER — Emergency Department (HOSPITAL_COMMUNITY)
Admission: EM | Admit: 2014-01-23 | Discharge: 2014-01-23 | Disposition: A | Discharge status: Home / Self Care | Payer: No Typology Code available for payment source | Attending: Emergency Medicine | Admitting: Emergency Medicine

## 2014-01-23 ENCOUNTER — Encounter (HOSPITAL_COMMUNITY): Payer: Self-pay | Admitting: *Deleted

## 2014-01-23 DIAGNOSIS — S199XXA Unspecified injury of neck, initial encounter: Secondary | ICD-10-CM | POA: Insufficient documentation

## 2014-01-23 DIAGNOSIS — Z3202 Encounter for pregnancy test, result negative: Secondary | ICD-10-CM | POA: Diagnosis not present

## 2014-01-23 DIAGNOSIS — Y92410 Unspecified street and highway as the place of occurrence of the external cause: Secondary | ICD-10-CM | POA: Insufficient documentation

## 2014-01-23 DIAGNOSIS — S7001XA Contusion of right hip, initial encounter: Secondary | ICD-10-CM | POA: Insufficient documentation

## 2014-01-23 DIAGNOSIS — Y998 Other external cause status: Secondary | ICD-10-CM | POA: Diagnosis not present

## 2014-01-23 DIAGNOSIS — S79911A Unspecified injury of right hip, initial encounter: Secondary | ICD-10-CM | POA: Diagnosis present

## 2014-01-23 DIAGNOSIS — Z87891 Personal history of nicotine dependence: Secondary | ICD-10-CM | POA: Diagnosis not present

## 2014-01-23 DIAGNOSIS — Y9389 Activity, other specified: Secondary | ICD-10-CM | POA: Insufficient documentation

## 2014-01-23 LAB — POC URINE PREG, ED: Preg Test, Ur: NEGATIVE

## 2014-01-23 MED ORDER — HYDROCODONE-ACETAMINOPHEN 5-325 MG PO TABS: 1.0000 | ORAL_TABLET | ORAL | Status: DC | PRN

## 2014-01-23 MED ORDER — IBUPROFEN 600 MG PO TABS: 600.0000 mg | ORAL_TABLET | Freq: Four times a day (QID) | ORAL | Status: DC | PRN

## 2014-01-23 MED ORDER — CYCLOBENZAPRINE HCL 10 MG PO TABS: 10.0000 mg | ORAL_TABLET | Freq: Two times a day (BID) | ORAL | Status: DC | PRN

## 2014-01-23 MED ORDER — IBUPROFEN 800 MG PO TABS
800.0000 mg | ORAL_TABLET | Freq: Once | ORAL | Status: AC
  Administered 2014-01-23: 800 mg via ORAL
  Filled 2014-01-23: qty 1
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
## 2014-02-22 ENCOUNTER — Emergency Department (HOSPITAL_COMMUNITY): Admission: EM | Admit: 2014-02-22 | Discharge: 2014-02-22 | Payer: Medicaid Other | Source: Home / Self Care

## 2014-03-21 ENCOUNTER — Other Ambulatory Visit (HOSPITAL_COMMUNITY)
Admission: RE | Admit: 2014-03-21 | Discharge: 2014-03-21 | Disposition: A | Discharge status: Home / Self Care | Payer: No Typology Code available for payment source | Source: Ambulatory Visit | Attending: Family Medicine | Admitting: Family Medicine

## 2014-03-21 ENCOUNTER — Encounter: Payer: Self-pay | Admitting: Family Medicine

## 2014-03-21 ENCOUNTER — Ambulatory Visit (INDEPENDENT_AMBULATORY_CARE_PROVIDER_SITE_OTHER): Payer: Self-pay | Admitting: Family Medicine

## 2014-03-21 VITALS — BP 108/71 | HR 82 | Temp 98.9°F | Ht 64.5 in | Wt 100.6 lb

## 2014-03-21 DIAGNOSIS — Z124 Encounter for screening for malignant neoplasm of cervix: Secondary | ICD-10-CM

## 2014-03-21 DIAGNOSIS — Z13220 Encounter for screening for lipoid disorders: Secondary | ICD-10-CM

## 2014-03-21 DIAGNOSIS — Z681 Body mass index (BMI) 19 or less, adult: Secondary | ICD-10-CM

## 2014-03-21 DIAGNOSIS — R918 Other nonspecific abnormal finding of lung field: Secondary | ICD-10-CM

## 2014-03-21 DIAGNOSIS — Z01419 Encounter for gynecological examination (general) (routine) without abnormal findings: Secondary | ICD-10-CM | POA: Insufficient documentation

## 2014-03-21 DIAGNOSIS — R911 Solitary pulmonary nodule: Secondary | ICD-10-CM

## 2014-03-21 DIAGNOSIS — Z113 Encounter for screening for infections with a predominantly sexual mode of transmission: Secondary | ICD-10-CM | POA: Insufficient documentation

## 2014-03-21 DIAGNOSIS — Z0000 Encounter for general adult medical examination without abnormal findings: Secondary | ICD-10-CM

## 2014-03-21 DIAGNOSIS — N841 Polyp of cervix uteri: Secondary | ICD-10-CM | POA: Insufficient documentation

## 2014-03-21 LAB — POCT WET PREP (WET MOUNT): CLUE CELLS WET PREP WHIFF POC: NEGATIVE

## 2014-03-21 LAB — TSH: TSH: 0.511 u[IU]/mL (ref 0.350–4.500)

## 2014-03-21 LAB — COMPREHENSIVE METABOLIC PANEL
ALBUMIN: 4.4 g/dL (ref 3.5–5.2)
AST: 11 U/L (ref 0–37)
Alkaline Phosphatase: 76 U/L (ref 39–117)
BILIRUBIN TOTAL: 0.4 mg/dL (ref 0.2–1.2)
BUN: 12 mg/dL (ref 6–23)
CO2: 24 mEq/L (ref 19–32)
CREATININE: 0.65 mg/dL (ref 0.50–1.10)
Calcium: 9.3 mg/dL (ref 8.4–10.5)
Chloride: 106 mEq/L (ref 96–112)
Glucose, Bld: 78 mg/dL (ref 70–99)
POTASSIUM: 3.7 meq/L (ref 3.5–5.3)
SODIUM: 142 meq/L (ref 135–145)
TOTAL PROTEIN: 7.1 g/dL (ref 6.0–8.3)

## 2014-03-21 LAB — LIPID PANEL
Cholesterol: 118 mg/dL (ref 0–200)
HDL: 61 mg/dL (ref >=46)
LDL CALC: 46 mg/dL (ref 0–99)
Total CHOL/HDL Ratio: 1.9 Ratio
Triglycerides: 53 mg/dL (ref <150)
VLDL: 11 mg/dL (ref 0–40)

## 2014-03-21 NOTE — Progress Notes (Signed)
Patient ID: Meghan Frederick, female   DOB: 05-Feb-1985, 29 y.o.   MRN: [id number] HPI:  Patient presents today for a well woman exam.   Concerns today: none Periods: just finished period, first one since stopping depo in Sept Contraception: was on depo but stopped Pelvic symptoms: has white vaginal discharge, no itching, no burning, no odor, no pelvic pain Sexual activity: one female partner in last year STD Screening: would like testing today Pap smear status: due today Exercise: exercises at home about 2x/week Diet: eating well, working on protein diet Smoking: nonsmoker, however used to smoke Alcohol: drinks most weekends about 3/4 weeks Drugs: no drug use  ROS: See HPI  PMFSH:  Cancers in family: lung cancer (mom died of lung cancer, was smoker, died at age 29), dad prostate cancer 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 Thin. HEENT: NCAT, PERRL, no palpable thyromegaly or anterior cervical lymphadenopathy Heart: RRR, no murmurs Lungs: CTAB, NWOB Abdomen: soft, nontender to palpation, ticklish Neuro: grossly nonfocal, speech normal GU: normal appearing external genitalia without lesions. Vagina is moist with white discharge. Cervix normal in appearance but does have pink polyp protruding from os. No cervical motion tenderness or tenderness on bimanual exam. No adnexal masses.   ASSESSMENT/PLAN:  # Health maintenance:  -STD screening: GC chlamydia, wet prep, HIV, RPR, acute hepatitis panel obtained today -pap smear: Obtained today, also referring to colposcopy clinic for visualized polyp -lipid screening: Check lipids and CMET today -Low BMI: Will check TSH along with other labs -Contraception: Patient is currently abstinent. She declines any additional forms of birth  control at this Meghan. Counseled her that she can come in at any Meghan to discuss this further if she desires. Counseled on the importance of condoms for prevention of STDs. -handout given on health maintenance topics  Polyp at cervical os Noted on exam today. Pap smear obtained. Instructed patient to schedule an appointment in colposcopy clinic to evaluate this polyp further. Reassured her that it is most likely benign, but warrants additional examination under colposcope. Precepted with Dr. Mauricio PoBreen who also examined patient and agrees with this plan.   Pulmonary nodules History of pulmonary nodules noted on problem list. This seems to been diagnosed on a CT scan back in 2011. Follow-up imaging in 12 months was recommended, however patient has not had any subsequent CT scans. She is willing undergo another CT of her chest to further evaluate the pulmonary nodules. I have a low suspicion that these are anything problematic, as she had a chest x-ray done back in December which was normal. I will schedule her for a CT scan.     FOLLOW UP: Schedule appt in colposcopy clinic F/u with me in 1 year for next physical  Meghan J. Pollie MeyerMcIntyre, MD [facility]

## 2014-03-21 NOTE — Assessment & Plan Note (Signed)
History of pulmonary nodules noted on problem list. This seems to been diagnosed on a CT scan back in 2011. Follow-up imaging in 12 months was recommended, however patient has not had any subsequent CT scans. She is willing undergo another CT of her chest to further evaluate the pulmonary nodules. I have a low suspicion that these are anything problematic, as she had a chest x-ray done back in December which was normal. I will schedule her for a CT scan.

## 2014-03-21 NOTE — Patient Instructions (Signed)
It was nice to meet you today!  Checking labwork. We'll call you with results or send you a letter. Schedule an appointment in [facility]copy clinic [facility] to have your cervix looked   atcloser. We are checking a CT scan of your chest to reevaluate the lymph nodes seen on your last CT scan  Be well, Dr. Ardelia Mems   Health Maintenance Adopting a healthy lifestyle and getting preventive care can go a long way to promote health and wellness. Talk with your health care provider about what schedule of regular examinations is right for you. This is a good chance for you to check in with your provider about disease prevention and staying healthy. In between checkups, there are plenty of things you can do on your own. Experts have done a lot of research about which lifestyle changes and preventive measures are most likely to keep you healthy. Ask your health care provider for more information. WEIGHT AND DIET  Eat a healthy diet  Be sure to include plenty of vegetables, fruits, low-fat dairy products, and lean protein.  Do not eat a lot of foods high in solid fats, added sugars, or salt.  Get regular exercise. This is one of the most important things you can do for your health.  Most adults should exercise for at least 150 minutes each week. The exercise should increase your heart rate and make you sweat (moderate-intensity exercise).  Most adults should also do strengthening exercises at least twice a week. This is in addition to the moderate-intensity exercise.  Maintain a healthy weight  Body mass index (BMI) is a measurement that can be used to identify possible weight problems. It estimates body fat based on height and weight. Your health care provider can help determine your BMI and help you achieve or maintain a healthy weight.  For females 68 years of age and older:   A BMI below 18.5 is considered underweight.  A BMI of 18.5 to 24.9 is normal.  A BMI of 25  to 29.9 is considered overweight.  A BMI of 30 and above is considered obese.  Watch levels of cholesterol and blood lipids  You should start having your blood tested for lipids and cholesterol at 29 years of age, then have this test every 5 years.  You may need to have your cholesterol levels checked more often if:  Your lipid or cholesterol levels are high.  You are older than 29 years of age.  You are at high risk for heart disease.  CANCER SCREENING   Lung Cancer  Lung cancer screening is recommended for adults 17-62 years old who are at high risk for lung cancer because of a history of smoking.  A yearly low-dose CT scan of the lungs is recommended for people who:  Currently smoke.  Have quit within the past 15 years.  Have at least a 30-pack-year history of smoking. A pack year is smoking an average of one pack of cigarettes a day for 1 year.  Yearly screening should continue until it has been 15 years since you quit.  Yearly screening should stop if you develop a health problem that would prevent you from having lung cancer treatment.  Breast Cancer  Practice breast self-awareness. This means understanding how your breasts normally appear and feel.  It also means doing regular breast self-exams. Let your health care provider know about any changes, no matter how small.  If you are in your 20s or 30s,  you should have a clinical breast exam (CBE) by a health care provider every 1-3 years as part of a regular health exam.  If you are 40 or older, have a CBE every year. Also consider having a breast X-ray (mammogram) every year.  If you have a family history of breast cancer, talk to your health care provider about genetic screening.  If you are at high risk for breast cancer, talk to your health care provider about having an MRI and a mammogram every year.  Breast cancer gene (BRCA) assessment is recommended for women who have family members with BRCA-related  cancers. BRCA-related cancers include:  Breast.  Ovarian.  Tubal.  Peritoneal cancers.  Results of the assessment will determine the need for genetic counseling and BRCA1 and BRCA2 testing. Cervical Cancer Routine pelvic examinations to screen for cervical cancer are no longer recommended for nonpregnant women who are considered low risk for cancer of the pelvic organs (ovaries, uterus, and vagina) and who do not have symptoms. A pelvic examination may be necessary if you have symptoms including those associated with pelvic infections. Ask your health care provider if a screening pelvic exam is right for you.   The Pap test is the screening test for cervical cancer for women who are considered at risk.  If you had a hysterectomy for a problem that was not cancer or a condition that could lead to cancer, then you no longer need Pap tests.  If you are older than 65 years, and you have had normal Pap tests for the past 10 years, you no longer need to have Pap tests.  If you have had past treatment for cervical cancer or a condition that could lead to cancer, you need Pap tests and screening for cancer for at least 20 years after your treatment.  If you no longer get a Pap test, assess your risk factors if they change (such as having a new sexual partner). This can affect whether you should start being screened again.  Some women have medical problems that increase their chance of getting cervical cancer. If this is the case for you, your health care provider may recommend more frequent screening and Pap tests.  The human papillomavirus (HPV) test is another test that may be used for cervical cancer screening. The HPV test looks for the virus that can cause cell changes in the cervix. The cells collected during the Pap test can be tested for HPV.  The HPV test can be used to screen women 20 years of age and older. Getting tested for HPV can extend the interval between normal Pap tests from  three to five years.  An HPV test also should be used to screen women of any age who have unclear Pap test results.  After 29 years of age, women should have HPV testing as often as Pap tests.  Colorectal Cancer  This type of cancer can be detected and often prevented.  Routine colorectal cancer screening usually begins at 29 years of age and continues through 29 years of age.  Your health care provider may recommend screening at an earlier age if you have risk factors for colon cancer.  Your health care provider may also recommend using home test kits to check for hidden blood in the stool.  A small camera at the end of a tube can be used to examine your colon directly (sigmoidoscopy or colonoscopy). This is done to check for the earliest forms of colorectal cancer.  Routine  screening usually begins at age 41.  Direct examination of the colon should be repeated every 5-10 years through 29 years of age. However, you may need to be screened more often if early forms of precancerous polyps or small growths are found. Skin Cancer  Check your skin from head to toe regularly.  Tell your health care provider about any new moles or changes in moles, especially if there is a change in a mole's shape or color.  Also tell your health care provider if you have a mole that is larger than the size of a pencil eraser.  Always use sunscreen. Apply sunscreen liberally and repeatedly throughout the day.  Protect yourself by wearing long sleeves, pants, a wide-brimmed hat, and sunglasses whenever you are outside. HEART DISEASE, DIABETES, AND HIGH BLOOD PRESSURE   Have your blood pressure checked at least every 1-2 years. High blood pressure causes heart disease and increases the risk of stroke.  If you are between 18 years and 80 years old, ask your health care provider if you should take aspirin to prevent strokes.  Have regular diabetes screenings. This involves taking a blood sample to check  your fasting blood sugar level.  If you are at a normal weight and have a low risk for diabetes, have this test once every three years after 29 years of age.  If you are overweight and have a high risk for diabetes, consider being tested at a younger age or more often. PREVENTING INFECTION  Hepatitis B  If you have a higher risk for hepatitis B, you should be screened for this virus. You are considered at high risk for hepatitis B if:  You were born in a country where hepatitis B is common. Ask your health care provider which countries are considered high risk.  Your parents were born in a high-risk country, and you have not been immunized against hepatitis B (hepatitis B vaccine).  You have HIV or AIDS.  You use needles to inject street drugs.  You live with someone who has hepatitis B.  You have had sex with someone who has hepatitis B.  You get hemodialysis treatment.  You take certain medicines for conditions, including cancer, organ transplantation, and autoimmune conditions. Hepatitis C  Blood testing is recommended for:  Everyone born from [birth year] through 1965.  Anyone with known risk factors for hepatitis C. Sexually transmitted infections (STIs)  You should be screened for sexually transmitted infections (STIs) including gonorrhea and chlamydia if:  You are sexually active and are younger than 29 years of age.  You are older than 29 years of age and your health care provider tells you that you are at risk for this type of infection.  Your sexual activity has changed since you were last screened and you are at an increased risk for chlamydia or gonorrhea. Ask your health care provider if you are at risk.  If you do not have HIV, but are at risk, it may be recommended that you take a prescription medicine daily to prevent HIV infection. This is called pre-exposure prophylaxis (PrEP). You are considered at risk if:  You are sexually active and do not regularly use  condoms or know the HIV status of your partner(s).  You take drugs by injection.  You are sexually active with a partner who has HIV. Talk with your health care provider about whether you are at high risk of being infected with HIV. If you choose to begin PrEP, you should first be  tested for HIV. You should then be tested every 3 months for as long as you are taking PrEP.  PREGNANCY   If you are premenopausal and you may become pregnant, ask your health care provider about preconception counseling.  If you may become pregnant, take 400 to 800 micrograms (mcg) of folic acid every day.  If you want to prevent pregnancy, talk to your health care provider about birth control (contraception). OSTEOPOROSIS AND MENOPAUSE   Osteoporosis is a disease in which the bones lose minerals and strength with aging. This can result in serious bone fractures. Your risk for osteoporosis can be identified using a bone density scan.  If you are 48 years of age or older, or if you are at risk for osteoporosis and fractures, ask your health care provider if you should be screened.  Ask your health care provider whether you should take a calcium or vitamin D supplement to lower your risk for osteoporosis.  Menopause may have certain physical symptoms and risks.  Hormone replacement therapy may reduce some of these symptoms and risks. Talk to your health care provider about whether hormone replacement therapy is right for you.  HOME CARE INSTRUCTIONS   Schedule regular health, dental, and eye exams.  Stay current with your immunizations.   Do not use any tobacco products including cigarettes, chewing tobacco, or electronic cigarettes.  If you are pregnant, do not drink alcohol.  If you are breastfeeding, limit how much and how often you drink alcohol.  Limit alcohol intake to no more than 1 drink per day for nonpregnant women. One drink equals 12 ounces of beer, 5 ounces of wine, or 1 ounces of hard  liquor.  Do not use street drugs.  Do not share needles.  Ask your health care provider for help if you need support or information about quitting drugs.  Tell your health care provider if you often feel depressed.  Tell your health care provider if you have ever been abused or do not feel safe at home. Document Released: 07/30/2010 Document Revised: 05/31/2013 Document Reviewed: 12/16/2012 [facility] Patient Information 2015 [place], [place]. This information is not intended to replace advice given to you by your health care provider. Make sure you discuss any questions you have with your health care provider.

## 2014-03-21 NOTE — Assessment & Plan Note (Signed)
Noted on exam today. Pap smear obtained. Instructed patient to schedule an appointment in colposcopy clinic to evaluate this polyp further. Reassured her that it is most likely benign, but warrants additional examination under colposcope. Precepted with Dr. Mauricio PoBreen who also examined patient and agrees with this plan.

## 2014-03-22 ENCOUNTER — Telehealth: Payer: Self-pay | Admitting: *Deleted

## 2014-03-22 LAB — HEPATITIS PANEL, ACUTE
HCV Ab: NEGATIVE
HEP B C IGM: NONREACTIVE
HEP B S AG: NEGATIVE
Hep A IgM: NONREACTIVE

## 2014-03-22 LAB — RPR

## 2014-03-22 LAB — CYTOLOGY - PAP

## 2014-03-22 LAB — HIV ANTIBODY (ROUTINE TESTING W REFLEX): HIV: NONREACTIVE

## 2014-03-22 NOTE — Telephone Encounter (Signed)
-----   Message from Latrelle DodrillBrittany J McIntyre, MD sent at 03/21/2014  2:09 PM EST ----- Please schedule pt for a non-urgent CT chest with contrast on a Monday or Tuesday, and call her with the appointment time. Thanks! [place]

## 2014-03-22 NOTE — Telephone Encounter (Signed)
[facility] Tried calling to inform patient however mailbox was full. Will try again later.

## 2014-03-24 ENCOUNTER — Encounter: Payer: Self-pay | Admitting: Family Medicine

## 2014-03-24 NOTE — Telephone Encounter (Signed)
Tried calling patient, mailbox full. [facility]/8 at 9:15am. Nothing to eat 4 hours prior to procedure.

## 2014-03-24 NOTE — Telephone Encounter (Signed)
appt needs to be next Tuesday not Thursday [phone]

## 2014-03-29 NOTE — Telephone Encounter (Signed)
Called again, mailbox full. Letter sent to patient address.

## 2014-03-31 ENCOUNTER — Ambulatory Visit (HOSPITAL_COMMUNITY): Payer: No Typology Code available for payment source

## 2014-04-04 ENCOUNTER — Telehealth: Payer: Self-pay | Admitting: Family Medicine

## 2014-04-04 NOTE — Telephone Encounter (Signed)
I sent a letter to patient. All test results were normal. Please call pt to inform. Thanks! Latrelle DodrillBrittany J Latia Mataya, MD

## 2014-04-04 NOTE — Telephone Encounter (Signed)
Had cpe on 03/21/14 and was wanting to know results. Please give call back to pt and inform at [phone] / thanks sr

## 2014-04-04 NOTE — Telephone Encounter (Signed)
Tried calling, mailbox full. Will try again later.

## 2014-04-05 ENCOUNTER — Encounter (HOSPITAL_COMMUNITY): Payer: Self-pay

## 2014-04-05 ENCOUNTER — Ambulatory Visit (HOSPITAL_COMMUNITY): Payer: Medicaid Other

## 2014-04-05 ENCOUNTER — Ambulatory Visit (HOSPITAL_COMMUNITY)
Admission: RE | Admit: 2014-04-05 | Discharge: 2014-04-05 | Disposition: A | Discharge status: Home / Self Care | Payer: Medicaid Other | Source: Ambulatory Visit | Attending: Family Medicine | Admitting: Family Medicine

## 2014-04-05 DIAGNOSIS — R918 Other nonspecific abnormal finding of lung field: Secondary | ICD-10-CM | POA: Insufficient documentation

## 2014-04-05 DIAGNOSIS — R911 Solitary pulmonary nodule: Secondary | ICD-10-CM

## 2014-04-05 MED ORDER — IOHEXOL 300 MG/ML  SOLN
80.0000 mL | Freq: Once | INTRAMUSCULAR | Status: AC | PRN
  Administered 2014-04-05: 80 mL via INTRAVENOUS

## 2014-04-05 NOTE — Telephone Encounter (Signed)
Tried calling, mailbox full. Letter with results has been sent.

## 2014-04-08 ENCOUNTER — Telehealth: Payer: Self-pay | Admitting: Family Medicine

## 2014-04-08 NOTE — Telephone Encounter (Signed)
Tried to call pt VM full. Will try again Monday. Meghan Frederick Bruna PotterBlount, CMA

## 2014-04-08 NOTE — Telephone Encounter (Signed)
Pt is calling to get the results of her ct scan. jw

## 2014-04-08 NOTE — Telephone Encounter (Signed)
Please inform patient that CT scan showed that pulmonary nodules remain small and stable in size. These are nothing to worry about. No further testing needed. Latrelle DodrillBrittany J Verdie Wilms, MD

## 2014-04-08 NOTE — Telephone Encounter (Signed)
Pt informed. Wants to know about the testing for the spot on her cervix that you and a preceptor talked about while in office please advise. Keyunna Coco Bruna PotterBlount, CMA

## 2014-04-08 NOTE — Telephone Encounter (Signed)
As instructed during office visit, she needs to [facility] an [facility]  Latrelle DodrillBrittany J Orlen Leedy, MD

## 2014-04-11 NOTE — Telephone Encounter (Signed)
Tried calling vm full, please inform patient to schedule appointment in colpo clinic should she call back. Thanks!

## 2014-05-16 ENCOUNTER — Ambulatory Visit (INDEPENDENT_AMBULATORY_CARE_PROVIDER_SITE_OTHER): Payer: Medicaid Other | Admitting: *Deleted

## 2014-05-16 ENCOUNTER — Encounter: Payer: Self-pay | Admitting: *Deleted

## 2014-05-16 DIAGNOSIS — Z3042 Encounter for surveillance of injectable contraceptive: Secondary | ICD-10-CM | POA: Diagnosis not present

## 2014-05-16 LAB — POCT URINE PREGNANCY: Preg Test, Ur: NEGATIVE

## 2014-05-16 MED ORDER — MEDROXYPROGESTERONE ACETATE 150 MG/ML IM SUSP
150.0000 mg | Freq: Once | INTRAMUSCULAR | Status: AC
  Administered 2014-05-16: 150 mg via INTRAMUSCULAR

## 2014-05-16 NOTE — Progress Notes (Signed)
   Pt in for Depo Provera injection.  Last Depo Provera given 08/2013. Pt stated last sex was last year 2015.  Pregnancy test ordered; results negative. Verbal order given by Dr. Gwendolyn GrantWalden for Depo Provera.  Pt tolerated Depo injection. Depo given left upper outer quadrant.  Next injection due July 4-August 15, 2014.  Pt advised to use a condom for the next 7-10 days and to return in 2 weeks for another prengancy test.Reminder card given. Clovis PuMartin, Tamika L, RN

## 2014-07-18 ENCOUNTER — Ambulatory Visit (INDEPENDENT_AMBULATORY_CARE_PROVIDER_SITE_OTHER): Payer: Self-pay | Admitting: Family Medicine

## 2014-07-18 DIAGNOSIS — R300 Dysuria: Secondary | ICD-10-CM

## 2014-07-19 NOTE — Progress Notes (Signed)
No show

## 2014-09-14 ENCOUNTER — Emergency Department (HOSPITAL_COMMUNITY)
Admission: EM | Admit: 2014-09-14 | Discharge: 2014-09-14 | Disposition: A | Discharge status: Home / Self Care | Payer: Medicaid Other | Attending: Emergency Medicine | Admitting: Emergency Medicine

## 2014-09-14 ENCOUNTER — Encounter (HOSPITAL_COMMUNITY): Payer: Self-pay | Admitting: Emergency Medicine

## 2014-09-14 DIAGNOSIS — N390 Urinary tract infection, site not specified: Secondary | ICD-10-CM | POA: Insufficient documentation

## 2014-09-14 DIAGNOSIS — Z87891 Personal history of nicotine dependence: Secondary | ICD-10-CM | POA: Insufficient documentation

## 2014-09-14 DIAGNOSIS — Z3202 Encounter for pregnancy test, result negative: Secondary | ICD-10-CM | POA: Insufficient documentation

## 2014-09-14 LAB — URINALYSIS, ROUTINE W REFLEX MICROSCOPIC
BILIRUBIN URINE: NEGATIVE
Glucose, UA: NEGATIVE mg/dL
HGB URINE DIPSTICK: NEGATIVE
Ketones, ur: NEGATIVE mg/dL
Nitrite: NEGATIVE
PROTEIN: NEGATIVE mg/dL
Specific Gravity, Urine: 1.024 (ref 1.005–1.030)
Urobilinogen, UA: 1 mg/dL (ref 0.0–1.0)
pH: 6 (ref 5.0–8.0)

## 2014-09-14 LAB — URINE MICROSCOPIC-ADD ON

## 2014-09-14 LAB — PREGNANCY, URINE: Preg Test, Ur: NEGATIVE

## 2014-09-14 MED ORDER — CEPHALEXIN 500 MG PO CAPS: 500.0000 mg | ORAL_CAPSULE | Freq: Four times a day (QID) | ORAL | Status: DC

## 2014-09-14 NOTE — Discharge Instructions (Signed)

## 2014-09-14 NOTE — ED Provider Notes (Signed)
CSN: [id number]     Arrival date & time 09/14/14  1657 History   First MD Initiated Contact with Patient 09/14/14 1938     Chief Complaint  Patient presents with  . Abdominal Pain     (Consider location/radiation/quality/duration/timing/severity/associated sxs/prior Treatment) Patient is a 29 y.o. female presenting with dysuria. The history is provided by the patient. No language interpreter was used.  Dysuria Pain quality:  Aching Pain severity:  Moderate Onset quality:  Gradual Duration:  2 days Timing:  Constant Progression:  Worsening Chronicity:  New Recent urinary tract infections: no   Relieved by:  Nothing Worsened by:  Nothing tried Ineffective treatments:  None tried Associated symptoms: no nausea, no vaginal discharge and no vomiting   Risk factors: not pregnant and not sexually active     History reviewed. No pertinent past medical history. History reviewed. No pertinent past surgical history. No family history on file. Social History  Substance Use Topics  . Smoking status: Former Games developer  . Smokeless tobacco: None  . Alcohol Use: Yes     Comment: social   OB History    No data available     Review of Systems  Gastrointestinal: Negative for nausea and vomiting.  Genitourinary: Positive for dysuria. Negative for vaginal discharge.  All other systems reviewed and are negative.     Allergies  Dust mite extract and Pollen extract  Home Medications   Prior to Admission medications   Medication Sig Start Date End Date Taking? Authorizing Provider  cetirizine (ZYRTEC) 10 MG tablet Take 10 mg by mouth daily as needed for allergies.   Yes Historical Provider, MD  guaiFENesin (MUCINEX) 600 MG 12 hr tablet Take 600 mg by mouth 2 (two) times daily as needed for cough.   Yes Historical Provider, MD   BP 114/70 mmHg  Pulse 68  Temp(Src) 98.4 F (36.9 C) (Oral)  Resp 16  SpO2 100% Physical Exam  Constitutional: She is oriented to person, place, and time.  She appears well-developed.  HENT:  Head: Normocephalic.  Right Ear: External ear normal.  Eyes: Conjunctivae are normal. Pupils are equal, round, and reactive to light.  Neck: Normal range of motion. Neck supple.  Cardiovascular: Normal rate and normal heart sounds.   Abdominal: Soft.  Musculoskeletal: Normal range of motion.  Neurological: She is alert and oriented to person, place, and time. She has normal reflexes.  Skin: Skin is warm.  Nursing note and vitals reviewed.   ED Course  Procedures (including critical care time) Labs Review Labs Reviewed  URINALYSIS, ROUTINE W REFLEX MICROSCOPIC (NOT AT [place]) - Abnormal; Notable for the following:    Color, Urine AMBER (*)    APPearance CLOUDY (*)    Leukocytes, UA TRACE (*)    All other components within normal limits  URINE MICROSCOPIC-ADD ON - Abnormal; Notable for the following:    Bacteria, UA MANY (*)    All other components within normal limits    Imaging Review No results found. I have personally reviewed and evaluated these images and lab results as part of my medical decision-making.   EKG Interpretation None      MDM   Final diagnoses:  UTI (lower urinary tract infection)    Keflex avs Return if any problems.    Lonia Skinner Brady, PA-C 09/14/14 2048  Lyndal Pulley, MD 09/15/14 [phone]

## 2014-09-14 NOTE — ED Notes (Signed)
Pt c/o lower abd pain over the past couple days with diarrhea this am. Pt denies n/v.  Pt states that she notices her urine is dark in color and was told she might have infection. Pt denies and dysuria or blood in urine.

## 2014-09-14 NOTE — ED Notes (Signed)
Pt ambulating independently w/ steady gait on d/c in no acute distress, A&Ox4. D/c instructions reviewed w/ pt - denies any further questions or concerns at present. Rx given x1

## 2014-09-19 ENCOUNTER — Ambulatory Visit: Payer: Medicaid Other | Admitting: Family Medicine

## 2015-03-12 ENCOUNTER — Emergency Department (HOSPITAL_COMMUNITY)
Admission: EM | Admit: 2015-03-12 | Discharge: 2015-03-12 | Disposition: A | Discharge status: Home / Self Care | Payer: No Typology Code available for payment source | Attending: Emergency Medicine | Admitting: Emergency Medicine

## 2015-03-12 ENCOUNTER — Encounter (HOSPITAL_COMMUNITY): Payer: Self-pay

## 2015-03-12 DIAGNOSIS — Z23 Encounter for immunization: Secondary | ICD-10-CM | POA: Insufficient documentation

## 2015-03-12 DIAGNOSIS — Z87891 Personal history of nicotine dependence: Secondary | ICD-10-CM | POA: Insufficient documentation

## 2015-03-12 DIAGNOSIS — Y9289 Other specified places as the place of occurrence of the external cause: Secondary | ICD-10-CM | POA: Insufficient documentation

## 2015-03-12 DIAGNOSIS — S0990XA Unspecified injury of head, initial encounter: Secondary | ICD-10-CM | POA: Insufficient documentation

## 2015-03-12 DIAGNOSIS — H1131 Conjunctival hemorrhage, right eye: Secondary | ICD-10-CM

## 2015-03-12 DIAGNOSIS — S0993XA Unspecified injury of face, initial encounter: Secondary | ICD-10-CM | POA: Insufficient documentation

## 2015-03-12 DIAGNOSIS — S0081XA Abrasion of other part of head, initial encounter: Secondary | ICD-10-CM

## 2015-03-12 DIAGNOSIS — Y9389 Activity, other specified: Secondary | ICD-10-CM | POA: Insufficient documentation

## 2015-03-12 DIAGNOSIS — S0511XA Contusion of eyeball and orbital tissues, right eye, initial encounter: Secondary | ICD-10-CM | POA: Insufficient documentation

## 2015-03-12 DIAGNOSIS — R51 Headache: Secondary | ICD-10-CM

## 2015-03-12 DIAGNOSIS — Z792 Long term (current) use of antibiotics: Secondary | ICD-10-CM | POA: Insufficient documentation

## 2015-03-12 DIAGNOSIS — Y998 Other external cause status: Secondary | ICD-10-CM | POA: Insufficient documentation

## 2015-03-12 DIAGNOSIS — R519 Headache, unspecified: Secondary | ICD-10-CM

## 2015-03-12 MED ORDER — TETANUS-DIPHTH-ACELL PERTUSSIS 5-2.5-18.5 LF-MCG/0.5 IM SUSP
0.5000 mL | Freq: Once | INTRAMUSCULAR | Status: AC
  Administered 2015-03-12: 0.5 mL via INTRAMUSCULAR
  Filled 2015-03-12: qty 0.5

## 2015-03-12 MED ORDER — FLUORESCEIN SODIUM 1 MG OP STRP
1.0000 | ORAL_STRIP | Freq: Once | OPHTHALMIC | Status: AC
  Administered 2015-03-12: 1 via OPHTHALMIC
  Filled 2015-03-12: qty 1

## 2015-03-12 MED ORDER — PROPARACAINE HCL 0.5 % OP SOLN
1.0000 [drp] | Freq: Once | OPHTHALMIC | Status: AC
  Administered 2015-03-12: 1 [drp] via OPHTHALMIC
  Filled 2015-03-12: qty 15

## 2015-03-12 MED ORDER — IBUPROFEN 800 MG PO TABS: 800.0000 mg | ORAL_TABLET | Freq: Three times a day (TID) | ORAL | Status: DC

## 2015-03-12 MED ORDER — IBUPROFEN 800 MG PO TABS
800.0000 mg | ORAL_TABLET | Freq: Once | ORAL | Status: AC
  Administered 2015-03-12: 800 mg via ORAL
  Filled 2015-03-12: qty 1
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
## 2015-03-12 NOTE — ED Notes (Signed)
Pt called for fast track, no response.

## 2015-03-12 NOTE — Discharge Instructions (Signed)
Subconjunctival Hemorrhage °Subconjunctival hemorrhage is bleeding that happens between the white part of your eye (sclera) and the clear membrane that covers the outside of your eye (conjunctiva). There are many tiny blood vessels near the surface of your eye. A subconjunctival hemorrhage happens when one or more of these vessels breaks and bleeds, causing a red patch to appear on your eye. This is similar to a bruise. °Depending on the amount of bleeding, the red patch may only cover a small area of your eye or it may cover the entire visible part of the sclera. If a lot of blood collects under the conjunctiva, there may also be swelling. Subconjunctival hemorrhages do not affect your vision or cause pain, but your eye may feel irritated if there is swelling. Subconjunctival hemorrhages usually do not require treatment, and they disappear on their own within two weeks. °CAUSES °This condition may be caused by: °· Mild trauma, such as rubbing your eye too hard. °· Severe trauma or blunt injuries. °· Coughing, sneezing, or vomiting. °· Straining, such as when lifting a heavy object. °· High blood pressure. °· Recent eye surgery. °· A history of diabetes. °· Certain medicines, especially blood thinners (anticoagulants). °· Other conditions, such as eye tumors, bleeding disorders, or blood vessel abnormalities. °Subconjunctival hemorrhages can happen without an obvious cause.  °SYMPTOMS  °Symptoms of this condition include: °· A bright red or dark red patch on the white part of the eye. °¨ The red area may spread out to cover a larger area of the eye before it goes away. °¨ The red area may turn brownish-yellow before it goes away. °· Swelling. °· Mild eye irritation. °DIAGNOSIS °This condition is diagnosed with a physical exam. If your subconjunctival hemorrhage was caused by trauma, your health care provider may refer you to an eye specialist (ophthalmologist) or another specialist to check for other injuries. You  may have other tests, including: °· An eye exam. °· A blood pressure check. °· Blood tests to check for bleeding disorders. °If your subconjunctival hemorrhage was caused by trauma, X-rays or a CT scan may be done to check for other injuries. °TREATMENT °Usually, no treatment is needed. Your health care provider may recommend eye drops or cold compresses to help with discomfort. °HOME CARE INSTRUCTIONS °· Take over-the-counter and prescription medicines only as directed by your health care provider. °· Use eye drops or cold compresses to help with discomfort as directed by your health care provider. °· Avoid activities, things, and environments that may irritate or injure your eye. °· Keep all follow-up visits as told by your health care provider. This is important. °SEEK MEDICAL CARE IF: °· You have pain in your eye. °· The bleeding does not go away within 3 weeks. °· You keep getting new subconjunctival hemorrhages. °SEEK IMMEDIATE MEDICAL CARE IF: °· Your vision changes or you have difficulty seeing. °· You suddenly develop severe sensitivity to light. °· You develop a severe headache, persistent vomiting, confusion, or abnormal tiredness (lethargy). °· Your eye seems to bulge or protrude from your eye socket. °· You develop unexplained bruises on your body. °· You have unexplained bleeding in another area of your body. °  °This information is not intended to replace advice given to you by your health care provider. Make sure you discuss any questions you have with your health care provider. °  °Document Released: 01/14/2005 Document Revised: 10/05/2014 Document Reviewed: 03/23/2014 °Elsevier Interactive Patient Education ©2016 [employer] ° °
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
## 2015-07-24 ENCOUNTER — Encounter: Payer: Medicaid Other | Admitting: Family Medicine

## 2015-09-12 ENCOUNTER — Ambulatory Visit: Payer: Medicaid Other

## 2015-12-19 ENCOUNTER — Emergency Department (HOSPITAL_COMMUNITY)
Admission: EM | Admit: 2015-12-19 | Discharge: 2015-12-19 | Disposition: A | Discharge status: Home / Self Care | Payer: Medicaid Other | Attending: Emergency Medicine | Admitting: Emergency Medicine

## 2015-12-19 ENCOUNTER — Encounter (HOSPITAL_COMMUNITY): Payer: Self-pay | Admitting: Emergency Medicine

## 2015-12-19 DIAGNOSIS — Z87891 Personal history of nicotine dependence: Secondary | ICD-10-CM | POA: Insufficient documentation

## 2015-12-19 DIAGNOSIS — J029 Acute pharyngitis, unspecified: Secondary | ICD-10-CM | POA: Insufficient documentation

## 2015-12-19 DIAGNOSIS — B9789 Other viral agents as the cause of diseases classified elsewhere: Secondary | ICD-10-CM

## 2015-12-19 DIAGNOSIS — J069 Acute upper respiratory infection, unspecified: Secondary | ICD-10-CM | POA: Insufficient documentation

## 2015-12-19 MED ORDER — NAPROXEN 250 MG PO TABS: 250.0000 mg | ORAL_TABLET | Freq: Two times a day (BID) | ORAL | 0 refills | Status: DC

## 2015-12-19 MED ORDER — CETIRIZINE HCL 10 MG PO TABS: 10.0000 mg | ORAL_TABLET | Freq: Every day | ORAL | 1 refills | Status: DC

## 2015-12-19 MED ORDER — BENZONATATE 100 MG PO CAPS: 100.0000 mg | ORAL_CAPSULE | Freq: Three times a day (TID) | ORAL | 0 refills | Status: DC | PRN

## 2015-12-19 MED ORDER — FLUTICASONE PROPIONATE 50 MCG/ACT NA SUSP: 2.0000 | Freq: Every day | NASAL | 0 refills | Status: DC

## 2015-12-19 NOTE — ED Triage Notes (Signed)
Patient c/o cough with intermittent yellow phlegm, sore throat and no voice x 2 days. Patient denies fevers.

## 2015-12-19 NOTE — ED Provider Notes (Signed)
[facility]-EMERGENCY DEPT Provider Note   CSN: [id number] Arrival date & time: 12/19/15  0707     History   Chief Complaint Chief Complaint  Patient presents with  . Cough  . no voice  . Sore Throat    HPI Meghan Frederick is a 30 y.o. female.  Meghan Frederick is a 30 y.o. Female who presents to the ED complaining of sneezing, nasal congestion, postnasal drip, runny nose, ear pressure, sore throat and cough for the past 2 days. She reports this morning she lost her voice. She denies any fevers. No treatments prior to arrival today. She reports pain with swallowing but no difficulty swallowing. She reports cough but no shortness of breath or wheezing. She denies fevers, shortness of breath, wheezing, chest pain, abdominal pain, nausea, vomiting, rashes, difficulty swallowing, neck pain, headaches or urinary symptoms.   The history is provided by the patient. No language interpreter was used.  Cough  Associated symptoms include rhinorrhea and sore throat. Pertinent negatives include no chest pain, no chills, no headaches, no shortness of breath and no wheezing.  Sore Throat  Pertinent negatives include no chest pain, no abdominal pain, no headaches and no shortness of breath.    History reviewed. No pertinent past medical history.  Patient Active Problem List   Diagnosis Date Noted  . 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 Well woman exam (no gynecological exam) 03/25/2012  . Right patellofemoral syndrome 03/25/2012  . Pulmonary nodules 12/06/2009  . PLEURISY 12/01/2009  . TOBACCO USER 12/10/2008  . WEIGHT LOSS 02/06/2007  . DEPRESSION, MAJOR, RECURRENT 03/27/2006    History reviewed. No pertinent surgical history.  OB History    No data available       Home Medications    Prior to Admission medications   Medication Sig Start Date End Date Taking? Authorizing Provider  benzonatate (TESSALON) 100 MG capsule Take 1 capsule (100 mg total) by mouth 3 (three) times daily as  needed for cough. 12/19/15   Everlene Farrier, PA-C  cetirizine (ZYRTEC ALLERGY) 10 MG tablet Take 1 tablet (10 mg total) by mouth daily. 12/19/15   Everlene Farrier, PA-C  fluticasone (FLONASE) 50 MCG/ACT nasal spray Place 2 sprays into both nostrils daily. 12/19/15   Everlene Farrier, PA-C  guaiFENesin (MUCINEX) 600 MG 12 hr tablet Take 600 mg by mouth 2 (two) times daily as needed for cough.    Historical Provider, MD  naproxen (NAPROSYN) 250 MG tablet Take 1 tablet (250 mg total) by mouth 2 (two) times daily with a meal. 12/19/15   Everlene Farrier, PA-C    Family History No family history on file.  Social History Social History  Substance Use Topics  . Smoking status: Former Games developer  . Smokeless tobacco: Never Used  . Alcohol use Yes     Comment: social     Allergies   Dust mite extract and Pollen extract   Review of Systems Review of Systems  Constitutional: Negative for chills and fever.  HENT: Positive for congestion, postnasal drip, rhinorrhea, sneezing, sore throat and voice change. Negative for ear discharge, facial swelling, nosebleeds and trouble swallowing.   Eyes: Negative for visual disturbance.  Respiratory: Positive for cough. Negative for shortness of breath and wheezing.   Cardiovascular: Negative for chest pain.  Gastrointestinal: Negative for abdominal pain, nausea and vomiting.  Musculoskeletal: Negative for neck pain and neck stiffness.  Skin: Negative for rash.  Neurological: Negative for syncope, light-headedness and headaches.     Physical Exam  Updated Vital Signs BP 130/79 (BP Location: Left Arm)   Pulse 89   Temp 98.6 F (37 C) (Oral)   Resp 16   Ht 5' 4.5" (1.638 m)   LMP 11/25/2015   SpO2 98%   Physical Exam  Constitutional: She appears well-developed and well-nourished. No distress.  Nontoxic appearing.  HENT:  Head: Normocephalic and atraumatic.  Right Ear: External ear normal.  Left Ear: External ear normal.  Mild middle ear effusion  noted bilaterally with no TM erythema or loss of landmarks.  Boggy nasal turbinates bilaterally. Mild posterior oropharyngeal erythema without edema. Evidence of postnasal drip. Mild bilateral tonsillar hypertrophy without exudates. Uvula is midline without edema. No peritonsillar abscess. No trismus. No drooling.   Eyes: Conjunctivae are normal. Pupils are equal, round, and reactive to light. Right eye exhibits no discharge. Left eye exhibits no discharge.  Neck: Normal range of motion. Neck supple. No JVD present. No tracheal deviation present.  Cardiovascular: Normal rate, regular rhythm, normal heart sounds and intact distal pulses.   Pulmonary/Chest: Effort normal and breath sounds normal. No stridor. No respiratory distress. She has no wheezes. She has no rales.  Lungs clear to auscultation bilaterally. No increased work of breathing. No rales or rhonchi.  Abdominal: Soft. There is no tenderness.  Musculoskeletal: She exhibits no edema or tenderness.  Lymphadenopathy:    She has no cervical adenopathy.  Neurological: She is alert. Coordination normal.  Skin: Skin is warm and dry. Capillary refill takes less than 2 seconds. No rash noted. She is not diaphoretic. No erythema. No pallor.  Psychiatric: She has a normal mood and affect. Her behavior is normal.  Nursing note and vitals reviewed.    ED Treatments / Results  Labs (all labs ordered are listed, but only abnormal results are displayed) Labs Reviewed - No data to display  EKG  EKG Interpretation None       Radiology No results found.  Procedures Procedures (including critical care time)  Medications Ordered in ED Medications - No data to display   Initial Impression / Assessment and Plan / ED Course  I have reviewed the triage vital signs and the nursing notes.  Pertinent labs & imaging results that were available during my care of the patient were reviewed by me and considered in my medical decision making  (see chart for details).  Clinical Course    This is a 30 y.o. Female who presents to the ED complaining of sneezing, nasal congestion, postnasal drip, runny nose, ear pressure, sore throat and cough for the past 2 days. She reports this morning she lost her voice. She denies any fevers. No treatments prior to arrival today. She reports pain with swallowing but no difficulty swallowing. She reports cough but no shortness of breath or wheezing. On exam the patient is afebrile nontoxic appearing. She has boggy nasal turbinates and rhinorrhea present. Mild tonsillar hypertrophy without exudates. No trismus. No drooling. No peritonsillar abscess. Lungs are clear to auscultation bilaterally. No increased work of breathing. Patient with upper respiratory infection. Patient has had no fevers. I see no need for rapid strep test or a chest x-ray as I have low suspicion for strep or pneumonia. Will treat patient with Zyrtec, Flonase, Tessalon Perles and naproxen. I advised if she develops fevers over 100.4 or new or worsening symptoms she should return to the emergency department for reevaluation. I advised the patient to follow-up with their primary care provider this week. I advised the patient to return to  the emergency department with new or worsening symptoms or new concerns. The patient verbalized understanding and agreement with plan.      Final Clinical Impressions(s) / ED Diagnoses   Final diagnoses:  Viral URI with cough  Sore throat    New Prescriptions New Prescriptions   BENZONATATE (TESSALON) 100 MG CAPSULE    Take 1 capsule (100 mg total) by mouth 3 (three) times daily as needed for cough.   CETIRIZINE (ZYRTEC ALLERGY) 10 MG TABLET    Take 1 tablet (10 mg total) by mouth daily.   FLUTICASONE (FLONASE) 50 MCG/ACT NASAL SPRAY    Place 2 sprays into both nostrils daily.   NAPROXEN (NAPROSYN) 250 MG TABLET    Take 1 tablet (250 mg total) by mouth 2 (two) times daily with a meal.     Everlene FarrierWilliam  Yehudit Fulginiti, PA-C 12/19/15 [id number]    Pricilla LovelessScott Goldston, MD 12/24/15 2204

## 2016-01-14 ENCOUNTER — Emergency Department (HOSPITAL_COMMUNITY)
Admission: EM | Admit: 2016-01-14 | Discharge: 2016-01-14 | Disposition: A | Discharge status: Home / Self Care | Payer: Medicaid Other | Attending: Emergency Medicine | Admitting: Emergency Medicine

## 2016-01-14 ENCOUNTER — Encounter (HOSPITAL_COMMUNITY): Payer: Self-pay | Admitting: Emergency Medicine

## 2016-01-14 DIAGNOSIS — Z87891 Personal history of nicotine dependence: Secondary | ICD-10-CM | POA: Insufficient documentation

## 2016-01-14 DIAGNOSIS — R55 Syncope and collapse: Secondary | ICD-10-CM | POA: Insufficient documentation

## 2016-01-14 LAB — CBG MONITORING, ED: Glucose-Capillary: 82 mg/dL (ref 65–99)

## 2016-01-14 NOTE — Discharge Instructions (Signed)
Avoid prolonged standing.  Push fluids stay hydrated

## 2016-01-14 NOTE — ED Provider Notes (Signed)
[facility]-EMERGENCY DEPT Provider Note   CSN: [id number] Arrival date & time: 01/14/16  1139     History   Chief Complaint Chief Complaint  Patient presents with  . Loss of Consciousness    HPI Meghan Frederick is a 30 y.o. female.She presents after a near syncopal episode. 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 States she didn't eat breakfast. She's not been eating or drinking much because the cold symptoms have been lingering for the last week. She felt hot and flushed. She felt like things were closing in around her like she was going to pass out. She squatted down. Some bystanders helped her lay on a bench. She did not have full syncope but felt very lightheaded and nauseated first few seconds. Arrival paramedics state that she was diaphoretic and they could not obtain blood pressure fell pulse that she was conscious. This slowly improved. She was sinus bradycardia on the monitor which improved rapidly in route she arrives here asymptomatic and hemodynamically intact.    Had previous episodes that are similar with prolonged standing or showers.  HPI  History reviewed. No pertinent past medical history.  Patient Active Problem List   Diagnosis Date Noted  . 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 Well woman exam (no gynecological exam) 03/25/2012  . Right patellofemoral syndrome 03/25/2012  . Pulmonary nodules 12/06/2009  . PLEURISY 12/01/2009  . TOBACCO USER 12/10/2008  . WEIGHT LOSS 02/06/2007  . DEPRESSION, MAJOR, RECURRENT 03/27/2006    History reviewed. No pertinent surgical history.  OB History    No data available       Home Medications    Prior to Admission medications   Medication Sig Start Date End Date Taking? Authorizing Provider  benzonatate (TESSALON) 100 MG capsule Take 1 capsule (100 mg total) by mouth 3 (three) times daily as needed for cough. 12/19/15   Everlene FarrierWilliam Dansie, PA-C  cetirizine (ZYRTEC ALLERGY) 10 MG  tablet Take 1 tablet (10 mg total) by mouth daily. 12/19/15   Everlene FarrierWilliam Dansie, PA-C  fluticasone (FLONASE) 50 MCG/ACT nasal spray Place 2 sprays into both nostrils daily. 12/19/15   Everlene FarrierWilliam Dansie, PA-C  guaiFENesin (MUCINEX) 600 MG 12 hr tablet Take 600 mg by mouth 2 (two) times daily as needed for cough.    Historical Provider, MD  naproxen (NAPROSYN) 250 MG tablet Take 1 tablet (250 mg total) by mouth 2 (two) times daily with a meal. 12/19/15   Everlene FarrierWilliam Dansie, PA-C    Family History History reviewed. No pertinent family history.  Social History Social History  Substance Use Topics  . Smoking status: Former Games developermoker  . Smokeless tobacco: Never Used  . Alcohol use Yes     Comment: social     Allergies   Dust mite extract and Pollen extract   Review of Systems Review of Systems  Constitutional: Negative for appetite change, chills, diaphoresis, fatigue and fever.  HENT: Negative for mouth sores, sore throat and trouble swallowing.   Eyes: Negative for visual disturbance.  Respiratory: Negative for cough, chest tightness, shortness of breath and wheezing.   Cardiovascular: Negative for chest pain.  Gastrointestinal: Negative for abdominal distention, abdominal pain, diarrhea, nausea and vomiting.  Endocrine: Negative for polydipsia, polyphagia and polyuria.  Genitourinary: Negative for dysuria, frequency and hematuria.  Musculoskeletal: Negative for gait problem.  Skin: Negative for color change, pallor and rash.  Neurological: Positive for light-headedness. Negative for dizziness, syncope and headaches.  Hematological: Does  not bruise/bleed easily.  Psychiatric/Behavioral: Negative for behavioral problems and confusion.     Physical Exam Updated Vital Signs BP 100/78 (BP Location: Right Arm)   Pulse 70   Temp 97.7 F (36.5 C) (Oral)   Resp 16   LMP 11/25/2015   SpO2 100%   Physical Exam  Constitutional: She is oriented to person, place, and time. She appears  well-developed and well-nourished. No distress.  HENT:  Head: Normocephalic.  Eyes: Conjunctivae are normal. Pupils are equal, round, and reactive to light. No scleral icterus.  Neck: Normal range of motion. Neck supple. No thyromegaly present.  Cardiovascular: Normal rate and regular rhythm.  Exam reveals no gallop and no friction rub.   No murmur heard. Pulmonary/Chest: Effort normal and breath sounds normal. No respiratory distress. She has no wheezes. She has no rales.  Abdominal: Soft. Bowel sounds are normal. She exhibits no distension. There is no tenderness. There is no rebound.  Musculoskeletal: Normal range of motion.  Neurological: She is alert and oriented to person, place, and time.  Skin: Skin is warm and dry. No rash noted.  Psychiatric: She has a normal mood and affect. Her behavior is normal.     ED Treatments / Results  Labs (all labs ordered are listed, but only abnormal results are displayed) Labs Reviewed  CBG MONITORING, ED    EKG  EKG Interpretation  Date/Time:  Sunday January 14 2016 11:55:41 EST Ventricular Rate:  62 PR Interval:    QRS Duration: 79 QT Interval:  389 QTC Calculation: 395 R Axis:   104 Text Interpretation:  Sinus rhythm Rightward Axis. No changes vs comparison. Confirmed by Fayrene FearingJAMES  MD, Dorraine Ellender ([id number]) on 01/14/2016 12:01:53 PM       Radiology No results found.  Procedures Procedures (including critical care time)  Medications Ordered in ED Medications - No data to display   Initial Impression / Assessment and Plan / ED Course  I have reviewed the triage vital signs and the nursing notes.  Pertinent labs & imaging results that were available during my care of the patient were reviewed by me and considered in my medical decision making (see chart for details).  Clinical Course     Normal rhythm. Stable vitals. Laboratory here. She declines labs and IV. I think this is perfectly reasonable. We discussed etiology of vasovagal  episodes. Cautioned against fall or injury with any future episodes. She is advised to sit or lay if any onset of prodrome. Was advised of the should be isolated incidents and should not be recurrent today. An i-STAT hydrated push fluids and recheck with any recurrence or additional symptoms.  Final Clinical Impressions(s) / ED Diagnoses   Final diagnoses:  Vaso vagal episode    New Prescriptions New Prescriptions   No medications on file     Rolland PorterMark Shayan Bramhall, MD 01/14/16 1337

## 2016-01-14 NOTE — ED Notes (Signed)
Bed: [id number] Expected date:  Expected time:  Means of arrival:  Comments: 30 yo syncope, VSS

## 2016-01-14 NOTE — ED Triage Notes (Signed)
Per EMS pt had 1 syncopal episode today, felt hot and dizzy, laid on bench, lost consciousness. EMS reports on arrival to scene pt was diaphoretic with no radial pulses, unable to obtain blood pressure, but that radial pulses returned and diaphoresis resolved shortly. Was feeling tired today due to working 2 jobs and not sleeping, not eating well, hasn't been drinking water. Recent URI.

## 2016-08-22 ENCOUNTER — Emergency Department (HOSPITAL_COMMUNITY): Payer: Medicaid Other

## 2016-08-22 ENCOUNTER — Emergency Department (HOSPITAL_COMMUNITY)
Admission: EM | Admit: 2016-08-22 | Discharge: 2016-08-23 | Disposition: A | Discharge status: Home / Self Care | Payer: Medicaid Other | Attending: Emergency Medicine | Admitting: Emergency Medicine

## 2016-08-22 ENCOUNTER — Encounter (HOSPITAL_COMMUNITY): Payer: Self-pay | Admitting: Emergency Medicine

## 2016-08-22 DIAGNOSIS — O26891 Other specified pregnancy related conditions, first trimester: Secondary | ICD-10-CM | POA: Diagnosis present

## 2016-08-22 DIAGNOSIS — B373 Candidiasis of vulva and vagina: Secondary | ICD-10-CM

## 2016-08-22 DIAGNOSIS — Z87891 Personal history of nicotine dependence: Secondary | ICD-10-CM | POA: Diagnosis not present

## 2016-08-22 DIAGNOSIS — R1030 Lower abdominal pain, unspecified: Secondary | ICD-10-CM

## 2016-08-22 DIAGNOSIS — Z3A10 10 weeks gestation of pregnancy: Secondary | ICD-10-CM

## 2016-08-22 DIAGNOSIS — E860 Dehydration: Secondary | ICD-10-CM | POA: Diagnosis not present

## 2016-08-22 DIAGNOSIS — O23591 Infection of other part of genital tract in pregnancy, first trimester: Secondary | ICD-10-CM | POA: Insufficient documentation

## 2016-08-22 DIAGNOSIS — O26899 Other specified pregnancy related conditions, unspecified trimester: Secondary | ICD-10-CM

## 2016-08-22 DIAGNOSIS — B3731 Acute candidiasis of vulva and vagina: Secondary | ICD-10-CM

## 2016-08-22 LAB — COMPREHENSIVE METABOLIC PANEL
ALT: 7 U/L — ABNORMAL LOW (ref 14–54)
ANION GAP: 8 (ref 5–15)
AST: 15 U/L (ref 15–41)
Albumin: 4.1 g/dL (ref 3.5–5.0)
Alkaline Phosphatase: 38 U/L (ref 38–126)
BILIRUBIN TOTAL: 0.5 mg/dL (ref 0.3–1.2)
BUN: 5 mg/dL — ABNORMAL LOW (ref 6–20)
CO2: 24 mmol/L (ref 22–32)
Calcium: 9.2 mg/dL (ref 8.9–10.3)
Chloride: 105 mmol/L (ref 101–111)
Creatinine, Ser: 0.58 mg/dL (ref 0.44–1.00)
GFR calc Af Amer: >60 mL/min (ref >60)
Glucose, Bld: 85 mg/dL (ref 65–99)
POTASSIUM: 3.4 mmol/L — AB (ref 3.5–5.1)
Sodium: 137 mmol/L (ref 135–145)
TOTAL PROTEIN: 6.9 g/dL (ref 6.5–8.1)

## 2016-08-22 LAB — WET PREP, GENITAL
SPERM: NONE SEEN
Trich, Wet Prep: NONE SEEN

## 2016-08-22 LAB — URINALYSIS, ROUTINE W REFLEX MICROSCOPIC
BILIRUBIN URINE: NEGATIVE
GLUCOSE, UA: 50 mg/dL — AB
Hgb urine dipstick: NEGATIVE
KETONES UR: NEGATIVE mg/dL
LEUKOCYTES UA: NEGATIVE
NITRITE: NEGATIVE
PH: 5 (ref 5.0–8.0)
PROTEIN: 30 mg/dL — AB
Specific Gravity, Urine: 1.035 — ABNORMAL HIGH (ref 1.005–1.030)

## 2016-08-22 LAB — CBC
HEMATOCRIT: 35.5 % — AB (ref 36.0–46.0)
Hemoglobin: 11.9 g/dL — ABNORMAL LOW (ref 12.0–15.0)
MCH: 27.9 pg (ref 26.0–34.0)
MCHC: 33.5 g/dL (ref 30.0–36.0)
MCV: 83.1 fL (ref 78.0–100.0)
Platelets: 261 10*3/uL (ref 150–400)
RBC: 4.27 MIL/uL (ref 3.87–5.11)
RDW: 11.8 % (ref 11.5–15.5)
WBC: 11.6 10*3/uL — AB (ref 4.0–10.5)

## 2016-08-22 LAB — HCG, QUANTITATIVE, PREGNANCY: hCG, Beta Chain, Quant, S: 137977 m[IU]/mL — ABNORMAL HIGH (ref <5)

## 2016-08-22 LAB — LIPASE, BLOOD: Lipase: 33 U/L (ref 11–51)

## 2016-08-22 MED ORDER — ACETAMINOPHEN 500 MG PO TABS
1000.0000 mg | ORAL_TABLET | Freq: Once | ORAL | Status: AC
  Administered 2016-08-22: 1000 mg via ORAL
  Filled 2016-08-22: qty 2

## 2016-08-22 NOTE — ED Triage Notes (Signed)
Pt states "im having some back pain, stomach pain, lower abdominal pain." Pt denies issues with urination. Vomited once. Mildly tender to palpation. LMP in may. Pt states she is 8.[redacted] weeks pregnant. Denies any bleeding.

## 2016-08-22 NOTE — ED Provider Notes (Signed)
[facility]-EMERGENCY DEPT Provider Note   CSN: [id number] Arrival date & time: 08/22/16  1750     History   Chief Complaint Chief Complaint  Patient presents with  . Abdominal Pain  . Routine Prenatal Visit    HPI Meghan Frederick is a 31 y.o. female with a hx of Depression presents to the Emergency Department complaining of gradual, persistent, progressively worsening lower abdominal pain described as cramping and constant onset 2 weeks ago. Associated symptoms include intermittent nausea and vomiting with 3 nonbloody and nonbilious episodes in the last 1 week. Patient reports she has not had any initial prenatal care. Last menstrual cycle began on 06/05/2016. Nothing seems to make her symptoms better or worse. She denies fevers or chills, headache, neck pain, chest pain, syncope, near syncope, dysuria, diarrhea, constipation. Patient denies vaginal bleeding or leakage of fluid. Patient denies recent sexual activity.   The history is provided by the patient and medical records. No language interpreter was used.    History reviewed. No pertinent past medical history.  Patient Active Problem List   Diagnosis Date Noted  . 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 Well woman exam (no gynecological exam) 03/25/2012  . Right patellofemoral syndrome 03/25/2012  . Pulmonary nodules 12/06/2009  . PLEURISY 12/01/2009  . TOBACCO USER 12/10/2008  . WEIGHT LOSS 02/06/2007  . DEPRESSION, MAJOR, RECURRENT 03/27/2006    History reviewed. No pertinent surgical history.  OB History    Gravida Para Term Preterm AB Living   1             SAB TAB Ectopic Multiple Live Births                   Home Medications    Prior to Admission medications   Medication Sig Start Date End Date Taking? Authorizing Provider  clotrimazole (GYNE-LOTRIMIN) 1 % vaginal cream Place 1 Applicatorful vaginally at bedtime. 08/23/16   Kaylean Tupou, Dahlia ClientHannah, PA-C  Doxylamine-Pyridoxine 10-10 MG TBEC Take 2 tabs at bedtime. If  symptoms are controlled, continue taking 2 tabs at bedtime. If symptoms persist, take 2 tabs at bedtime, then 1 tab in the morning of Day 3 and 2 tabs at bedtime. If symptoms are controlled on Day 4, continue as scheduled. If symptoms are not controlled, increase dose to 1 tab in the morning, 1 tab midafternoon, and 2 tabs in the evening. Take as scheduled and not on an as needed basis. Max: 4 tabs daily. 08/22/16   Shayon Trompeter, Dahlia ClientHannah, PA-C  Prenatal Vit-Fe Fumarate-FA (PRENATAL COMPLETE) 14-0.4 MG TABS Take 2 tablets by mouth daily. 08/22/16   Laurell Coalson, Dahlia ClientHannah, PA-C    Family History No family history on file.  Social History Social History  Substance Use Topics  . Smoking status: Former Games developermoker  . Smokeless tobacco: Never Used  . Alcohol use Yes     Comment: social     Allergies   Dust mite extract and Pollen extract   Review of Systems Review of Systems  Constitutional: Negative for appetite change, diaphoresis, fatigue, fever and unexpected weight change.  HENT: Negative for mouth sores.   Eyes: Negative for visual disturbance.  Respiratory: Negative for cough, chest tightness, shortness of breath and wheezing.   Cardiovascular: Negative for chest pain.  Gastrointestinal: Positive for abdominal pain, nausea and vomiting. Negative for constipation and diarrhea.  Endocrine: Negative for polydipsia, polyphagia and polyuria.  Genitourinary: Negative for dysuria, frequency, hematuria and urgency.  Musculoskeletal: Negative for back pain and neck stiffness.  Skin:  Negative for rash.  Allergic/Immunologic: Negative for immunocompromised state.  Neurological: Negative for syncope, light-headedness and headaches.  Hematological: Does not bruise/bleed easily.  Psychiatric/Behavioral: Negative for sleep disturbance. The patient is not nervous/anxious.   All other systems reviewed and are negative.    Physical Exam Updated Vital Signs BP (!) 103/58   Pulse 73   Temp 98.2 F  (36.8 C) (Oral)   Resp 16   LMP 06/22/2016   SpO2 100%   Physical Exam  Constitutional: She appears well-developed and well-nourished. No distress.  Awake, alert, nontoxic appearance  HENT:  Head: Normocephalic and atraumatic.  Mouth/Throat: Oropharynx is clear and moist. No oropharyngeal exudate.  Eyes: Conjunctivae are normal. No scleral icterus.  Neck: Normal range of motion. Neck supple.  Cardiovascular: Normal rate, regular rhythm, normal heart sounds and intact distal pulses.   No murmur heard. Pulmonary/Chest: Effort normal and breath sounds normal. No respiratory distress. She has no wheezes.  Equal chest expansion  Abdominal: Soft. Bowel sounds are normal. She exhibits no mass. There is tenderness ( Minimal) in the suprapubic area. There is no rebound and no guarding. Hernia confirmed negative in the right inguinal area and confirmed negative in the left inguinal area.  Genitourinary: No labial fusion. There is no rash, tenderness or lesion on the right labia. There is no rash, tenderness or lesion on the left labia. Uterus is enlarged ( Gravid). Uterus is not deviated, not fixed and not tender. Cervix exhibits no motion tenderness, no discharge and no friability. Right adnexum displays no mass, no tenderness and no fullness. Left adnexum displays no mass, no tenderness and no fullness. No erythema, tenderness or bleeding in the vagina. No foreign body in the vagina. No signs of injury around the vagina. Vaginal discharge (Leukorrhea, some thicker, white vaginal discharge) found.  Genitourinary Comments: Chaperone present  Musculoskeletal: Normal range of motion. She exhibits no edema.  Lymphadenopathy:       Right: No inguinal adenopathy present.       Left: No inguinal adenopathy present.  Neurological: She is alert.  Speech is clear and goal oriented Moves extremities without ataxia  Skin: Skin is warm and dry. She is not diaphoretic. No erythema.  Psychiatric: She has a  normal mood and affect.  Nursing note and vitals reviewed.    ED Treatments / Results  Labs (all labs ordered are listed, but only abnormal results are displayed) Labs Reviewed  WET PREP, GENITAL - Abnormal; Notable for the following:       Result Value   Yeast Wet Prep HPF POC PRESENT (*)    Clue Cells Wet Prep HPF POC PRESENT (*)    WBC, Wet Prep HPF POC MANY (*)    All other components within normal limits  COMPREHENSIVE METABOLIC PANEL - Abnormal; Notable for the following:    Potassium 3.4 (*)    BUN 5 (*)    ALT 7 (*)    All other components within normal limits  CBC - Abnormal; Notable for the following:    WBC 11.6 (*)    Hemoglobin 11.9 (*)    HCT 35.5 (*)    All other components within normal limits  URINALYSIS, ROUTINE W REFLEX MICROSCOPIC - Abnormal; Notable for the following:    APPearance HAZY (*)    Specific Gravity, Urine 1.035 (*)    Glucose, UA 50 (*)    Protein, ur 30 (*)    Bacteria, UA RARE (*)    Squamous Epithelial / LPF 0-5 (*)  All other components within normal limits  HCG, QUANTITATIVE, PREGNANCY - Abnormal; Notable for the following:    hCG, Beta Chain, Quant, Kathie Rhodes 161,096 (*)    All other components within normal limits  LIPASE, BLOOD  GC/CHLAMYDIA PROBE AMP ([place]) NOT AT [facility]    Radiology US Ob Comp < 14 Wks  Result Date: 08/22/2016 CLINICAL DATA:  31 y/o  F; lower abdominal pain.  Pregnant patient. EXAM: OBSTETRIC <14 WK [place] AND TRANSVAGINAL OB US TECHNIQUE: Both transabdominal and transvaginal ultrasound examinations were performed for complete evaluation of the gestation as well as the maternal uterus, adnexal regions, and pelvic cul-de-sac. Transvaginal technique was performed to assess early pregnancy. COMPARISON:  04/16/2005 pelvic ultrasound FINDINGS: Intrauterine gestational sac: Single Yolk sac:  Visualized. Embryo:  Visualized. Cardiac Activity: Visualized. Heart Rate: 155  bpm CRL:  37.4  mm   10 w   4 d                  [place] EDC:  03/16/2017 Subchorionic hemorrhage:  Small subchorionic hemorrhage. Placenta: There heterogeneous hypoechoic foci within the anterior aspect of the placenta without mass effect, probably representing placental venous lakes. Maternal uterus/adnexae: The right ovary measures 3.4 x 1.8 x 2.0 cm. The left ovary measures 2.8 x 2.2 x 2.1 cm. No ovarian mass identified. Normal appearance of the uterus. IMPRESSION: 1. Single live intrauterine pregnancy with estimated gestational age of [redacted] weeks and 4 days. 2. Small subchorionic hemorrhage. 3. Heterogeneous hypoechoic foci within anterior aspect of placenta without mass effect, probably representing placental venous lakes. Electronically Signed   By: Mitzi Hansen M.D.   On: 08/22/2016 23:07   US Ob Transvaginal  Result Date: 08/22/2016 CLINICAL DATA:  31 y/o  F; lower abdominal pain.  Pregnant patient. EXAM: OBSTETRIC <14 WK [place] AND TRANSVAGINAL OB US TECHNIQUE: Both transabdominal and transvaginal ultrasound examinations were performed for complete evaluation of the gestation as well as the maternal uterus, adnexal regions, and pelvic cul-de-sac. Transvaginal technique was performed to assess early pregnancy. COMPARISON:  04/16/2005 pelvic ultrasound FINDINGS: Intrauterine gestational sac: Single Yolk sac:  Visualized. Embryo:  Visualized. Cardiac Activity: Visualized. Heart Rate: 155  bpm CRL:  37.4  mm   10 w   4 d                  [place] EDC: 03/16/2017 Subchorionic hemorrhage:  Small subchorionic hemorrhage. Placenta: There heterogeneous hypoechoic foci within the anterior aspect of the placenta without mass effect, probably representing placental venous lakes. Maternal uterus/adnexae: The right ovary measures 3.4 x 1.8 x 2.0 cm. The left ovary measures 2.8 x 2.2 x 2.1 cm. No ovarian mass identified. Normal appearance of the uterus. IMPRESSION: 1. Single live intrauterine pregnancy with estimated gestational age of [redacted] weeks and 4 days. 2. Small subchorionic  hemorrhage. 3. Heterogeneous hypoechoic foci within anterior aspect of placenta without mass effect, probably representing placental venous lakes. Electronically Signed   By: Mitzi Hansen M.D.   On: 08/22/2016 23:07    Procedures Procedures (including critical care time)  Medications Ordered in ED Medications  acetaminophen (TYLENOL) tablet 1,000 mg (1,000 mg Oral Given 08/22/16 2211)     Initial Impression / Assessment and Plan / ED Course  I have reviewed the triage vital signs and the nursing notes.  Pertinent labs & imaging results that were available during my care of the patient were reviewed by me and considered in my medical decision making (see chart for details).  Clinical  Course as of Jul 27 0001  Thu Aug 22, 2016  2350 Patient reports she continues to have some lower abdominal pain. Her abdomen remained soft without guarding or rebound on repeat abdominal exam.  [HM]  2350 Patient does have increased specific gravity with protein in her urine. I recommended IV hydration here in the emergency department patient has declined. I discussed the importance of oral hydration at home as dehydration can cause abdominal cramping and pregnancy and may be the cause of her abdominal pain. Specific Gravity, Urine: (!) 1.035 [HM]  2350 Yeast present on wet prep. Will treat with intravaginal treatments. Yeast Wet Prep HPF POC: (!) PRESENT [HM]  2351 Mild hypokalemia, likely secondary to her intermittent vomiting. Discussed importance of potassium rich food and prenatal vitamin. Patient states understanding. Potassium: (!) 3.4 [HM]    Clinical Course User Index [HM] Yvan Dority, Dahlia Client, [place]    Patient presents with lower abdominal cramping 2 weeks. No vaginal bleeding or leakage of fluid.  No blood in the vaginal vault. Small amount of vaginal discharge but no cervical motion tenderness or adnexal tenderness. Wet prep with yeast.   UA with some signs of dehydration. Patient  declined IV hydration she does not want an IV. Discussed the potential cause of her abdominal cramping as dehydration. She reports she will drink by mouth fluids. She has tolerated by mouth here without difficulty. On repeat exam her abdomen remained soft without rebound or guarding. She was given Tylenol for her pain. Ultrasound confirms IUP with fetal heart rate. Patient will be started on prenatal vitamins.  Final Clinical Impressions(s) / ED Diagnoses   Final diagnoses:  [redacted] weeks gestation of pregnancy  Dehydration  Pregnancy related bilateral lower abdominal pain, antepartum  Vaginal yeast infection    New Prescriptions New Prescriptions   CLOTRIMAZOLE (GYNE-LOTRIMIN) 1 % VAGINAL CREAM    Place 1 Applicatorful vaginally at bedtime.   DOXYLAMINE-PYRIDOXINE 10-10 MG TBEC    Take 2 tabs at bedtime. If symptoms are controlled, continue taking 2 tabs at bedtime. If symptoms persist, take 2 tabs at bedtime, then 1 tab in the morning of Day 3 and 2 tabs at bedtime. If symptoms are controlled on Day 4, continue as scheduled. If symptoms are not controlled, increase dose to 1 tab in the morning, 1 tab midafternoon, and 2 tabs in the evening. Take as scheduled and not on an as needed basis. Max: 4 tabs daily.   PRENATAL VIT-FE FUMARATE-FA (PRENATAL COMPLETE) 14-0.4 MG TABS    Take 2 tablets by mouth daily.     Lahoma Constantin, Boyd Kerbs 08/23/16 0007    Rolland Porter, MD 09/05/16 [phone]

## 2016-08-23 LAB — GC/CHLAMYDIA PROBE AMP (~~LOC~~) NOT AT ARMC
Chlamydia: NEGATIVE
Neisseria Gonorrhea: NEGATIVE

## 2016-08-23 MED ORDER — CLOTRIMAZOLE 1 % VA CREA: 1.0000 | TOPICAL_CREAM | Freq: Every day | VAGINAL | 0 refills | Status: DC

## 2016-08-23 MED ORDER — DOXYLAMINE-PYRIDOXINE 10-10 MG PO TBEC: DELAYED_RELEASE_TABLET | ORAL | 0 refills | Status: DC

## 2016-08-23 MED ORDER — PRENATAL COMPLETE 14-0.4 MG PO TABS: 2.0000 | ORAL_TABLET | Freq: Every day | ORAL | 0 refills | Status: DC

## 2016-08-23 NOTE — Discharge Instructions (Signed)
1. Medications: diclegis, prenatal vitamins, clotrimazole, usual home medications 2. Treatment: rest, drink plenty of fluids,  3. Follow Up: Please followup with your primary doctor and OB/GYN in 3-7 days for discussion of your diagnoses and further evaluation after today's visit; if you do not have a primary care doctor use the resource guide provided to find one; Please return to the ER for vaginal bleeding, worsening pain, persistent vomiting or other concerns

## 2016-09-06 ENCOUNTER — Encounter (HOSPITAL_COMMUNITY): Payer: Self-pay | Admitting: *Deleted

## 2016-09-06 ENCOUNTER — Inpatient Hospital Stay (HOSPITAL_COMMUNITY)
Admission: AD | Admit: 2016-09-06 | Discharge: 2016-09-06 | Disposition: A | Discharge status: Home / Self Care | Payer: Medicaid Other | Source: Ambulatory Visit | Attending: Family Medicine | Admitting: Family Medicine

## 2016-09-06 DIAGNOSIS — Z3A11 11 weeks gestation of pregnancy: Secondary | ICD-10-CM | POA: Insufficient documentation

## 2016-09-06 DIAGNOSIS — N898 Other specified noninflammatory disorders of vagina: Secondary | ICD-10-CM | POA: Diagnosis not present

## 2016-09-06 DIAGNOSIS — R109 Unspecified abdominal pain: Secondary | ICD-10-CM | POA: Insufficient documentation

## 2016-09-06 DIAGNOSIS — Z87891 Personal history of nicotine dependence: Secondary | ICD-10-CM | POA: Diagnosis not present

## 2016-09-06 DIAGNOSIS — O26891 Other specified pregnancy related conditions, first trimester: Secondary | ICD-10-CM | POA: Diagnosis not present

## 2016-09-06 DIAGNOSIS — Z79899 Other long term (current) drug therapy: Secondary | ICD-10-CM | POA: Insufficient documentation

## 2016-09-06 DIAGNOSIS — R42 Dizziness and giddiness: Secondary | ICD-10-CM | POA: Diagnosis present

## 2016-09-06 HISTORY — DX: Other specified health status: Z78.9

## 2016-09-06 LAB — URINALYSIS, ROUTINE W REFLEX MICROSCOPIC
BILIRUBIN URINE: NEGATIVE
Glucose, UA: NEGATIVE mg/dL
HGB URINE DIPSTICK: NEGATIVE
Ketones, ur: NEGATIVE mg/dL
Nitrite: NEGATIVE
PROTEIN: 30 mg/dL — AB
Specific Gravity, Urine: 1.027 (ref 1.005–1.030)
pH: 5 (ref 5.0–8.0)

## 2016-09-06 LAB — WET PREP, GENITAL
Clue Cells Wet Prep HPF POC: NONE SEEN
Sperm: NONE SEEN
TRICH WET PREP: NONE SEEN
YEAST WET PREP: NONE SEEN

## 2016-09-06 NOTE — MAU Note (Signed)
Feeling a Intriago dizzy.  Having a Lehnert pain, cramping started this morning.. Feeling a Lowrey dehydrated, mouth is really dry. Nausea, has not thrown up, denies diarrhea or constipation.

## 2016-09-06 NOTE — MAU Provider Note (Signed)
History     CSN: [id number] Arrival date and time: 09/06/16 4098  First Provider Initiated Contact with Patient 09/06/16 1140      Chief Complaint  Patient presents with  . Dizziness  . Abdominal Pain    HPI: Meghan Frederick is a 31 y.o. G1P0 with IUP at [redacted]w[redacted]d who presents to maternity admissions reporting abdominal pain and dizziness since this morning. She reports pain along her lower abdomen. Denies vaginal bleeding. Endorses vaginal discharge. Denies urinary symptoms, including dysuria, hematuria, or urinary frequency.   Seen for the same at [facility] ED on 08/22/16. Had an U/S then showing IUP. She was also noted to be dehydrated then. Reports she does tno drink water, only drinks soda and juice.   Has had some nausea, but no vomiting.  She is planning on establishing care at [facility].   Past obstetric history: OB History  Gravida Para Term Preterm AB Living  1            SAB TAB Ectopic Multiple Live Births               # Outcome Date GA Lbr Len/2nd Weight Sex Delivery Anes PTL Lv  1 Current               Past Medical History:  Diagnosis Date  . Medical history non-contributory     Past Surgical History:  Procedure Laterality Date  . NO PAST SURGERIES      Family History  Problem Relation Age of Onset  . Cancer Mother        lung  . Cancer Father        prostate    Social History  Substance Use Topics  . Smoking status: Former Games developer  . Smokeless tobacco: Never Used     Comment: 2011  . Alcohol use Yes     Comment: social, stopped prior to learning of preg    Allergies:  Allergies  Allergen Reactions  . Dust Mite Extract Itching    Runny nose, sore throat  . Pollen Extract Itching    Prescriptions Prior to Admission  Medication Sig Dispense Refill Last Dose  . clotrimazole (GYNE-LOTRIMIN) 1 % vaginal cream Place 1 Applicatorful vaginally at bedtime. 45 g 0   . Doxylamine-Pyridoxine 10-10 MG TBEC Take 2 tabs at bedtime. If symptoms  are controlled, continue taking 2 tabs at bedtime. If symptoms persist, take 2 tabs at bedtime, then 1 tab in the morning of Day 3 and 2 tabs at bedtime. If symptoms are controlled on Day 4, continue as scheduled. If symptoms are not controlled, increase dose to 1 tab in the morning, 1 tab midafternoon, and 2 tabs in the evening. Take as scheduled and not on an as needed basis. Max: 4 tabs daily. 60 tablet 0   . Prenatal Vit-Fe Fumarate-FA (PRENATAL COMPLETE) 14-0.4 MG TABS Take 2 tablets by mouth daily. 60 each 0     Review of Systems - Negative except for what is mentioned in HPI.  Physical Exam   Blood pressure 104/64, pulse 71, temperature 98.2 F (36.8 C), resp. rate 16, weight 96 lb 12 oz (43.9 kg), last menstrual period 06/22/2016, SpO2 100 %.  Constitutional: Well-developed, well-nourished female in no acute distress.  Cardiovascular: normal rate, regular rythm Respiratory: normal effort, lungs CTAB.  GI: Abd soft, non-distended. Mild suprapubic TTP. No rebound or guarding.  GU: Neg CVAT.  Pelvic: NEFG, physiologic discharge, no blood, cervix clean. No CMT MSK:  Extremities nontender, no edema, normal ROM Neurologic: Alert and oriented x 4. Psych: Normal mood and affect   FHT:  160  MAU Course  Procedures  MDM. Exam reassuring Reviewed U/S from 08/12/16 showing viable IUP. Wet prep negative VSS. Urine w/o signs of dehydration. Urine sent for culture.  GC/Chlamydia probe sent  Assessment and Plan  Assessment: 1. Abdominal pain, unspecified abdominal location     Plan: --Discussed return precautions --Counseled on water intake, at least 8 glasses a day --Dicussed safe meds in pregnancy.  --Urine culture and GC/Chlamydia probe sent as above --Discharge home in stable condition.  --[facility]   Phoebe Marter, Kandra NicolasJulie P, MD 09/06/2016 11:45 AM

## 2016-09-06 NOTE — Discharge Instructions (Signed)
SAFE MEDICATIONS IN PREGNANCY  Acne:  Benzoyl Peroxide  Salicylic Acid   Backache/Headache:  Tylenol: 2 regular strength every 4 hours OR               2 Extra strength every 6 hours   Colds/Coughs/Allergies:  Benadryl (alcohol free) 25 mg every 6 hours as needed  Breath right strips  Claritin  Cepacol throat lozenges  Chloraseptic throat spray  Cold-Eeze- up to three times per day  Cough drops, alcohol free  Flonase (by prescription only)  Guaifenesin  Mucinex  Robitussin DM (plain only, alcohol free)  Saline nasal spray/drops  Sudafed (pseudoephedrine) & Actifed * use only after [redacted] weeks gestation and if you do not have high blood pressure  Tylenol  Vicks Vaporub  Zinc lozenges  Zyrtec   Constipation:  Colace  Ducolax suppositories  Fleet enema  Glycerin suppositories  Metamucil  Milk of magnesia  Miralax  Senokot  Smooth move tea   Diarrhea:  Kaopectate  Imodium A-D   *NO pepto Bismol   Hemorrhoids:  Anusol  Anusol HC  Preparation H  Tucks   Indigestion:  Tums  Maalox  Mylanta  Zantac  Pepcid   Insomnia:  Benadryl (alcohol free) 25mg every 6 hours as needed  Tylenol PM  Unisom, no Gelcaps   Leg Cramps:  Tums  MagGel   Nausea/Vomiting:  Bonine  Dramamine  Emetrol  Ginger extract  Sea bands  Meclizine  Nausea medication to take during pregnancy:  Unisom (doxylamine succinate 25 mg tablets) Take one tablet daily at bedtime. If symptoms are not adequately controlled, the dose can be increased to a maximum recommended dose of two tablets daily (1/2 tablet in the morning, 1/2 tablet mid-afternoon and one at bedtime).  Vitamin B6 100mg tablets. Take one tablet twice a day (up to 200 mg per day).   Skin Rashes:  Aveeno products  Benadryl cream or 25mg every 6 hours as needed  Calamine Lotion  1% cortisone cream   Yeast infection:  Gyne-lotrimin 7  Monistat 7    **If taking multiple medications, please check labels to avoid  duplicating the same active ingredients  **take medication as directed on the label  ** Do not exceed 4000 mg of tylenol in 24 hours  **Do not take medications that contain aspirin or ibuprofen   

## 2016-09-07 LAB — URINE CULTURE

## 2016-09-09 LAB — GC/CHLAMYDIA PROBE AMP (~~LOC~~) NOT AT ARMC
Chlamydia: NEGATIVE
Neisseria Gonorrhea: NEGATIVE

## 2016-09-10 ENCOUNTER — Other Ambulatory Visit: Payer: Medicaid Other

## 2016-09-10 DIAGNOSIS — Z3481 Encounter for supervision of other normal pregnancy, first trimester: Secondary | ICD-10-CM

## 2016-09-12 LAB — OBSTETRIC PANEL, INCLUDING HIV
ANTIBODY SCREEN: NEGATIVE
BASOS: 0 %
Basophils Absolute: 0 10*3/uL (ref 0.0–0.2)
EOS (ABSOLUTE): 0.1 10*3/uL (ref 0.0–0.4)
Eos: 0 %
HEMATOCRIT: 34.7 % (ref 34.0–46.6)
HIV Screen 4th Generation wRfx: NONREACTIVE
Hemoglobin: 11.1 g/dL (ref 11.1–15.9)
Hepatitis B Surface Ag: NEGATIVE
IMMATURE GRANS (ABS): 0 10*3/uL (ref 0.0–0.1)
Immature Granulocytes: 0 %
LYMPHS ABS: 1.6 10*3/uL (ref 0.7–3.1)
LYMPHS: 14 %
MCH: 28.1 pg (ref 26.6–33.0)
MCHC: 32 g/dL (ref 31.5–35.7)
MCV: 88 fL (ref 79–97)
MONOS ABS: 0.5 10*3/uL (ref 0.1–0.9)
Monocytes: 4 %
NEUTROS PCT: 82 %
Neutrophils Absolute: 9.1 10*3/uL — ABNORMAL HIGH (ref 1.4–7.0)
PLATELETS: 246 10*3/uL (ref 150–379)
RBC: 3.95 x10E6/uL (ref 3.77–5.28)
RDW: 13.4 % (ref 12.3–15.4)
RH TYPE: POSITIVE
RPR Ser Ql: NONREACTIVE
Rubella Antibodies, IGG: 1.13 index (ref >0.99)
WBC: 11.3 10*3/uL — AB (ref 3.4–10.8)

## 2016-09-12 LAB — SICKLE CELL SCREEN: SICKLE CELL SCREEN: NEGATIVE

## 2016-09-13 ENCOUNTER — Ambulatory Visit (INDEPENDENT_AMBULATORY_CARE_PROVIDER_SITE_OTHER): Payer: Medicaid Other | Admitting: Family Medicine

## 2016-09-13 ENCOUNTER — Encounter: Payer: Self-pay | Admitting: Family Medicine

## 2016-09-13 VITALS — BP 98/58 | HR 90 | Temp 98.4°F | Wt 100.0 lb

## 2016-09-13 DIAGNOSIS — O0932 Supervision of pregnancy with insufficient antenatal care, second trimester: Secondary | ICD-10-CM | POA: Diagnosis not present

## 2016-09-13 LAB — POCT URINALYSIS DIP (MANUAL ENTRY)
BILIRUBIN UA: NEGATIVE
BILIRUBIN UA: NEGATIVE mg/dL
Blood, UA: NEGATIVE
GLUCOSE UA: NEGATIVE mg/dL
LEUKOCYTES UA: NEGATIVE
Nitrite, UA: NEGATIVE
SPEC GRAV UA: 1.025 (ref 1.010–1.025)
Urobilinogen, UA: 0.2 E.U./dL
pH, UA: 7 (ref 5.0–8.0)

## 2016-09-13 NOTE — Progress Notes (Signed)
Urine cultyu

## 2016-09-13 NOTE — Patient Instructions (Signed)
First Trimester of Pregnancy The first trimester of pregnancy is from week 1 until the end of week 13 (months 1 through 3). A week after a sperm fertilizes an egg, the egg will implant on the wall of the uterus. This embryo will begin to develop into a baby. Genes from you and your partner will form the baby. The female genes will determine whether the baby will be a boy or a girl. At 6-8 weeks, the eyes and face will be formed, and the heartbeat can be seen on ultrasound. At the end of 12 weeks, all the baby's organs will be formed. Now that you are pregnant, you will want to do everything you can to have a healthy baby. Two of the most important things are to get good prenatal care and to follow your health care provider's instructions. Prenatal care is all the medical care you receive before the baby's birth. This care will help prevent, find, and treat any problems during the pregnancy and childbirth. Body changes during your first trimester Your body goes through many changes during pregnancy. The changes vary from woman to woman.  You may gain or lose a couple of pounds at first.  You may feel sick to your stomach (nauseous) and you may throw up (vomit). If the vomiting is uncontrollable, call your health care provider.  You may tire easily.  You may develop headaches that can be relieved by medicines. All medicines should be approved by your health care provider.  You may urinate more often. Painful urination may mean you have a bladder infection.  You may develop heartburn as a result of your pregnancy.  You may develop constipation because certain hormones are causing the muscles that push stool through your intestines to slow down.  You may develop hemorrhoids or swollen veins (varicose veins).  Your breasts may begin to grow larger and become tender. Your nipples may stick out more, and the tissue that surrounds them (areola) may become darker.  Your gums may bleed and may be  sensitive to brushing and flossing.  Dark spots or blotches (chloasma, mask of pregnancy) may develop on your face. This will likely fade after the baby is born.  Your menstrual periods will stop.  You may have a loss of appetite.  You may develop cravings for certain kinds of food.  You may have changes in your emotions from day to day, such as being excited to be pregnant or being concerned that something may go wrong with the pregnancy and baby.  You may have more vivid and strange dreams.  You may have changes in your hair. These can include thickening of your hair, rapid growth, and changes in texture. Some women also have hair loss during or after pregnancy, or hair that feels dry or thin. Your hair will most likely return to normal after your baby is born.  What to expect at prenatal visits During a routine prenatal visit:  You will be weighed to make sure you and the baby are growing normally.  Your blood pressure will be taken.  Your abdomen will be measured to track your baby's growth.  The fetal heartbeat will be listened to between weeks 10 and 14 of your pregnancy.  Test results from any previous visits will be discussed.  Your health care provider may ask you:  How you are feeling.  If you are feeling the baby move.  If you have had any abnormal symptoms, such as leaking fluid, bleeding, severe headaches,   or abdominal cramping.  If you are using any tobacco products, including cigarettes, chewing tobacco, and electronic cigarettes.  If you have any questions.  Other tests that may be performed during your first trimester include:  Blood tests to find your blood type and to check for the presence of any previous infections. The tests will also be used to check for low iron levels (anemia) and protein on red blood cells (Rh antibodies). Depending on your risk factors, or if you previously had diabetes during pregnancy, you may have tests to check for high blood  sugar that affects pregnant women (gestational diabetes).  Urine tests to check for infections, diabetes, or protein in the urine.  An ultrasound to confirm the proper growth and development of the baby.  Fetal screens for spinal cord problems (spina bifida) and Down syndrome.  HIV (human immunodeficiency virus) testing. Routine prenatal testing includes screening for HIV, unless you choose not to have this test.  You may need other tests to make sure you and the baby are doing well.  Follow these instructions at home: Medicines  Follow your health care provider's instructions regarding medicine use. Specific medicines may be either safe or unsafe to take during pregnancy.  Take a prenatal vitamin that contains at least 600 micrograms (mcg) of folic acid.  If you develop constipation, try taking a stool softener if your health care provider approves. Eating and drinking  Eat a balanced diet that includes fresh fruits and vegetables, whole grains, good sources of protein such as meat, eggs, or tofu, and low-fat dairy. Your health care provider will help you determine the amount of weight gain that is right for you.  Avoid raw meat and uncooked cheese. These carry germs that can cause birth defects in the baby.  Eating four or five small meals rather than three large meals a day may help relieve nausea and vomiting. If you start to feel nauseous, eating a few soda crackers can be helpful. Drinking liquids between meals, instead of during meals, also seems to help ease nausea and vomiting.  Limit foods that are high in fat and processed sugars, such as fried and sweet foods.  To prevent constipation: ? Eat foods that are high in fiber, such as fresh fruits and vegetables, whole grains, and beans. ? Drink enough fluid to keep your urine clear or pale yellow. Activity  Exercise only as directed by your health care provider. Most women can continue their usual exercise routine during  pregnancy. Try to exercise for 30 minutes at least 5 days a week. Exercising will help you: ? Control your weight. ? Stay in shape. ? Be prepared for labor and delivery.  Experiencing pain or cramping in the lower abdomen or lower back is a good sign that you should stop exercising. Check with your health care provider before continuing with normal exercises.  Try to avoid standing for long periods of time. Move your legs often if you must stand in one place for a long time.  Avoid heavy lifting.  Wear low-heeled shoes and practice good posture.  You may continue to have sex unless your health care provider tells you not to. Relieving pain and discomfort  Wear a good support bra to relieve breast tenderness.  Take warm sitz baths to soothe any pain or discomfort caused by hemorrhoids. Use hemorrhoid cream if your health care provider approves.  Rest with your legs elevated if you have leg cramps or low back pain.  If you develop   varicose veins in your legs, wear support hose. Elevate your feet for 15 minutes, 3-4 times a day. Limit salt in your diet. Prenatal care  Schedule your prenatal visits by the twelfth week of pregnancy. They are usually scheduled monthly at first, then more often in the last 2 months before delivery.  Write down your questions. Take them to your prenatal visits.  Keep all your prenatal visits as told by your health care provider. This is important. Safety  Wear your seat belt at all times when driving.  Make a list of emergency phone numbers, including numbers for family, friends, the hospital, and police and fire departments. General instructions  Ask your health care provider for a referral to a local prenatal education class. Begin classes no later than the beginning of month 6 of your pregnancy.  Ask for help if you have counseling or nutritional needs during pregnancy. Your health care provider can offer advice or refer you to specialists for help  with various needs.  Do not use hot tubs, steam rooms, or saunas.  Do not douche or use tampons or scented sanitary pads.  Do not cross your legs for long periods of time.  Avoid cat litter boxes and soil used by cats. These carry germs that can cause birth defects in the baby and possibly loss of the fetus by miscarriage or stillbirth.  Avoid all smoking, herbs, alcohol, and medicines not prescribed by your health care provider. Chemicals in these products affect the formation and growth of the baby.  Do not use any products that contain nicotine or tobacco, such as cigarettes and e-cigarettes. If you need help quitting, ask your health care provider. You may receive counseling support and other resources to help you quit.  Schedule a dentist appointment. At home, brush your teeth with a soft toothbrush and be gentle when you floss. Contact a health care provider if:  You have dizziness.  You have mild pelvic cramps, pelvic pressure, or nagging pain in the abdominal area.  You have persistent nausea, vomiting, or diarrhea.  You have a bad smelling vaginal discharge.  You have pain when you urinate.  You notice increased swelling in your face, hands, legs, or ankles.  You are exposed to fifth disease or chickenpox.  You are exposed to German measles (rubella) and have never had it. Get help right away if:  You have a fever.  You are leaking fluid from your vagina.  You have spotting or bleeding from your vagina.  You have severe abdominal cramping or pain.  You have rapid weight gain or loss.  You vomit blood or material that looks like coffee grounds.  You develop a severe headache.  You have shortness of breath.  You have any kind of trauma, such as from a fall or a car accident. Summary  The first trimester of pregnancy is from week 1 until the end of week 13 (months 1 through 3).  Your body goes through many changes during pregnancy. The changes vary from  woman to woman.  You will have routine prenatal visits. During those visits, your health care provider will examine you, discuss any test results you may have, and talk with you about how you are feeling. This information is not intended to replace advice given to you by your health care provider. Make sure you discuss any questions you have with your health care provider. Document Released: 01/08/2001 Document Revised: 12/27/2015 Document Reviewed: 12/27/2015 Elsevier Interactive Patient Education  2017 [employer]   Inc[employer]  

## 2016-09-13 NOTE — Progress Notes (Signed)
Meghan Frederick is a 31 y.o. yo G1P0 at [redacted]w[redacted]d who presents for her initial prenatal visit. Pregnancy is not planned She reports breast tenderness, fatigue and frequent urination. She  is taking PNV. See flow sheet for details.  PMH, POBH, FH, meds, allergies and Social Hx reviewed.  Prenatal Exam: Gen: Well nourished, well developed.  No distress.   Vitals:   09/13/16 0838  BP: (!) 98/58  Pulse: 90  Temp: 98.4 F (36.9 C)   HEENT: Normocephalic, atraumatic.  Neck supple without cervical lymphadenopathy, thyromegaly or thyroid nodules.  Fair dentition. CV: RRR no murmur, gallops or rubs Lungs: CTAB.  Normal respiratory effort without wheezes or rales. Abd: soft, NTND. +BS.  Uterus not appreciated above pelvis. GU: Normal external female genitalia without lesions.  Normal vaginal, well rugated without lesions. No vaginal discharge.  Bimanual exam: No adnexal mass or TTP. No CMT.  Uterus size 10'' Ext: No clubbing, cyanosis or edema. Psych: Normal grooming and dress.  Not depressed or anxious appearing.  Normal thought content and process without flight of ideas or looseness of associations.  Assessment & Plan: 1) 31 y.o. yo G1P0 at [redacted]w[redacted]d via early ultrasound doing well.  Current pregnancy issues include dehydration. Dating is reliable. Prenatal labs reviewed, notable for UA showed specific gravity 1.025 with trace protein consistent with dehydration. Discus need for increase hydration during pregnancy to avoid false contraction as well as dizziness given low BP. Genetic screening offered: patient has been schedule for integrated screen . Early glucola is not indicated.  PHQ-9 and Pregnancy Medical Home forms completed and reviewed.  Bleeding and pain precautions reviewed. Importance of prenatal vitamins reviewed.  Follow up in 4 weeks.

## 2016-09-15 ENCOUNTER — Emergency Department (HOSPITAL_COMMUNITY)
Admission: EM | Admit: 2016-09-15 | Discharge: 2016-09-15 | Disposition: A | Discharge status: Home / Self Care | Payer: Medicaid Other | Attending: Emergency Medicine | Admitting: Emergency Medicine

## 2016-09-15 ENCOUNTER — Encounter (HOSPITAL_COMMUNITY): Payer: Self-pay | Admitting: Emergency Medicine

## 2016-09-15 DIAGNOSIS — O26892 Other specified pregnancy related conditions, second trimester: Secondary | ICD-10-CM | POA: Diagnosis not present

## 2016-09-15 DIAGNOSIS — R42 Dizziness and giddiness: Secondary | ICD-10-CM | POA: Diagnosis not present

## 2016-09-15 DIAGNOSIS — Z3A14 14 weeks gestation of pregnancy: Secondary | ICD-10-CM | POA: Diagnosis not present

## 2016-09-15 DIAGNOSIS — Z87891 Personal history of nicotine dependence: Secondary | ICD-10-CM | POA: Insufficient documentation

## 2016-09-15 DIAGNOSIS — R55 Syncope and collapse: Secondary | ICD-10-CM | POA: Diagnosis not present

## 2016-09-15 DIAGNOSIS — R109 Unspecified abdominal pain: Secondary | ICD-10-CM | POA: Diagnosis not present

## 2016-09-15 DIAGNOSIS — Z79899 Other long term (current) drug therapy: Secondary | ICD-10-CM | POA: Insufficient documentation

## 2016-09-15 LAB — URINALYSIS, ROUTINE W REFLEX MICROSCOPIC
BILIRUBIN URINE: NEGATIVE
Glucose, UA: NEGATIVE mg/dL
Hgb urine dipstick: NEGATIVE
Ketones, ur: NEGATIVE mg/dL
NITRITE: NEGATIVE
PH: 7 (ref 5.0–8.0)
Protein, ur: NEGATIVE mg/dL
SPECIFIC GRAVITY, URINE: 1.006 (ref 1.005–1.030)

## 2016-09-15 LAB — BASIC METABOLIC PANEL
Anion gap: 6 (ref 5–15)
BUN: 7 mg/dL (ref 6–20)
CALCIUM: 9 mg/dL (ref 8.9–10.3)
CHLORIDE: 106 mmol/L (ref 101–111)
CO2: 23 mmol/L (ref 22–32)
CREATININE: 0.49 mg/dL (ref 0.44–1.00)
Glucose, Bld: 98 mg/dL (ref 65–99)
Potassium: 3.8 mmol/L (ref 3.5–5.1)
SODIUM: 135 mmol/L (ref 135–145)

## 2016-09-15 LAB — CBC WITH DIFFERENTIAL/PLATELET
BASOS PCT: 0 %
Basophils Absolute: 0 10*3/uL (ref 0.0–0.1)
EOS ABS: 0 10*3/uL (ref 0.0–0.7)
EOS PCT: 0 %
HCT: 32.6 % — ABNORMAL LOW (ref 36.0–46.0)
HEMOGLOBIN: 11 g/dL — AB (ref 12.0–15.0)
LYMPHS ABS: 1.7 10*3/uL (ref 0.7–4.0)
Lymphocytes Relative: 13 %
MCH: 28.4 pg (ref 26.0–34.0)
MCHC: 33.7 g/dL (ref 30.0–36.0)
MCV: 84 fL (ref 78.0–100.0)
MONOS PCT: 4 %
Monocytes Absolute: 0.6 10*3/uL (ref 0.1–1.0)
NEUTROS PCT: 83 %
Neutro Abs: 11.2 10*3/uL — ABNORMAL HIGH (ref 1.7–7.7)
PLATELETS: 261 10*3/uL (ref 150–400)
RBC: 3.88 MIL/uL (ref 3.87–5.11)
RDW: 13 % (ref 11.5–15.5)
WBC: 13.6 10*3/uL — AB (ref 4.0–10.5)

## 2016-09-15 LAB — URINE CULTURE, OB REFLEX: Organism ID, Bacteria: NO GROWTH

## 2016-09-15 LAB — CULTURE, OB URINE

## 2016-09-15 MED ORDER — SODIUM CHLORIDE 0.9 % IV BOLUS (SEPSIS)
1000.0000 mL | Freq: Once | INTRAVENOUS | Status: AC
  Administered 2016-09-15: 1000 mL via INTRAVENOUS

## 2016-09-15 NOTE — ED Provider Notes (Signed)
[facility]-EMERGENCY DEPT Provider Note   CSN: [id number] Arrival date & time: 09/15/16  1407     History   Chief Complaint Chief Complaint  Patient presents with  . Dizziness    HPI Meghan Frederick is a 31 y.o. female.  HPI G1P0 @ 14 weeks who presents after syncopal event. Patient was at [facility], reports feeling very hot and overheated. She started to feel lightheaded, sat down. Immediately upon standing she "fainted", and fell to the ground on left side. She remember the entire event, and denies LOC. She also thinks she hit her abdomen. She has had some decreased po intake, was told by her OBGYN on Friday that she needs to be drinking more water. She denies any nausea, vomiting, fevers or chills. She currently describes mild abdominal pain, denies any loss of fluids, vaginal bleeding.   Past Medical History:  Diagnosis Date  . Medical history non-contributory     Patient Active Problem List   Diagnosis Date Noted  . 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 Well woman exam (no gynecological exam) 03/25/2012  . Right patellofemoral syndrome 03/25/2012  . Pulmonary nodules 12/06/2009  . PLEURISY 12/01/2009  . TOBACCO USER 12/10/2008  . WEIGHT LOSS 02/06/2007  . DEPRESSION, MAJOR, RECURRENT 03/27/2006    Past Surgical History:  Procedure Laterality Date  . NO PAST SURGERIES      OB History    Gravida Para Term Preterm AB Living   1             SAB TAB Ectopic Multiple Live Births                   Home Medications    Prior to Admission medications   Medication Sig Start Date End Date Taking? Authorizing Provider  Prenatal Vit-Fe Fumarate-FA (PRENATAL COMPLETE) 14-0.4 MG TABS Take 2 tablets by mouth daily. 08/22/16  Yes Muthersbaugh, Dahlia Client, PA-C  clotrimazole (GYNE-LOTRIMIN) 1 % vaginal cream Place 1 Applicatorful vaginally at bedtime. Patient not taking: Reported on 09/06/2016 08/23/16   Muthersbaugh, Dahlia Client, PA-C  Doxylamine-Pyridoxine 10-10 MG TBEC Take 2 tabs at bedtime.  If symptoms are controlled, continue taking 2 tabs at bedtime. If symptoms persist, take 2 tabs at bedtime, then 1 tab in the morning of Day 3 and 2 tabs at bedtime. If symptoms are controlled on Day 4, continue as scheduled. If symptoms are not controlled, increase dose to 1 tab in the morning, 1 tab midafternoon, and 2 tabs in the evening. Take as scheduled and not on an as needed basis. Max: 4 tabs daily. Patient not taking: Reported on 09/06/2016 08/22/16   Muthersbaugh, Dahlia Client, PA-C    Family History Family History  Problem Relation Age of Onset  . Cancer Mother        lung  . Cancer Father        prostate    Social History Social History  Substance Use Topics  . Smoking status: Former Smoker    Quit date: 01/28/2009  . Smokeless tobacco: Never Used     Comment: 2011  . Alcohol use Yes     Comment: social, stopped prior to learning of preg     Allergies   Dust mite extract and Pollen extract   Review of Systems Review of Systems  Constitutional: Negative for chills and fever.  HENT: Negative for ear pain and sore throat.   Eyes: Negative for pain and visual disturbance.  Respiratory: Negative for cough and shortness of breath.  Cardiovascular: Negative for chest pain and palpitations.  Gastrointestinal: Positive for abdominal pain. Negative for vomiting.  Genitourinary: Negative for dysuria, hematuria, pelvic pain, vaginal bleeding, vaginal discharge and vaginal pain.  Musculoskeletal: Negative for arthralgias and back pain.  Skin: Negative for color change and rash.  Neurological: Positive for light-headedness. Negative for seizures and syncope.  All other systems reviewed and are negative.    Physical Exam Updated Vital Signs BP 127/67   Pulse (!) 104   Temp 98.5 F (36.9 C) (Oral)   Resp (!) 23   LMP 06/22/2016   SpO2 100%   Physical Exam  Constitutional: She appears well-developed and well-nourished. No distress.  HENT:  Head: Normocephalic.  Swelling  and tenderness to L zygoma. Abrasion just inferior to left eyebrow, hemostatic.  Eyes: Conjunctivae are normal.  Neck: Neck supple.  Cardiovascular: Normal rate and regular rhythm.   No murmur heard. Pulmonary/Chest: Effort normal and breath sounds normal. No respiratory distress.  Abdominal: Soft. There is no tenderness.  Musculoskeletal: She exhibits no edema.  Neurological: She is alert.  Skin: Skin is warm and dry.  Psychiatric: She has a normal mood and affect.  Nursing note and vitals reviewed.    ED Treatments / Results  Labs (all labs ordered are listed, but only abnormal results are displayed) Labs Reviewed  CBC WITH DIFFERENTIAL/PLATELET - Abnormal; Notable for the following:       Result Value   WBC 13.6 (*)    Hemoglobin 11.0 (*)    HCT 32.6 (*)    Neutro Abs 11.2 (*)    All other components within normal limits  URINALYSIS, ROUTINE W REFLEX MICROSCOPIC - Abnormal; Notable for the following:    Color, Urine STRAW (*)    APPearance HAZY (*)    Leukocytes, UA SMALL (*)    Bacteria, UA RARE (*)    Squamous Epithelial / LPF 6-30 (*)    All other components within normal limits  BASIC METABOLIC PANEL    EKG  EKG Interpretation None       Radiology No results found.  Procedures Procedures (including critical care time) EMERGENCY DEPARTMENT [place] PREGNANCY "Study: Limited Ultrasound of the Pelvis for Pregnancy"  INDICATIONS:Pregnancy(required) Multiple views of the uterus and pelvic cavity were obtained in real-time with a multi-frequency probe.  APPROACH:Transabdominal  PERFORMED BY: Myself IMAGES ARCHIVED?: Yes LIMITATIONS: none PREGNANCY FREE FLUID: None ADNEXAL FINDINGS: None GESTATIONAL AGE, ESTIMATE: [redacted]w[redacted]d FETAL HEART RATE: 176 INTERPRETATION: Fetal heart activity seen     Medications Ordered in ED Medications  sodium chloride 0.9 % bolus 1,000 mL (0 mLs Intravenous Stopped 09/15/16 1751)     Initial Impression / Assessment and Plan / ED  Course  I have reviewed the triage vital signs and the nursing notes.  Pertinent labs & imaging results that were available during my care of the patient were reviewed by me and considered in my medical decision making (see chart for details).    Patient is a G1P0 at 14 weeks who presents after near syncopal event at [facility]. Felt overheated and lightheaded prior to event. Has been told by OB that she is dehydrated 2 days prior. Patient arrived [facility] in no acute distress. Exam as above. No abdominal tenderness. No vaginal bleeding or contractions. Mild swelling L zygoma, low suspicion for facial fracture. Shared decision making with patient, elected not to obtain additional face imaging.   Labs stable. Bedside ultrasound significant for FHR 176. EKG without acute changes. Patient treated with IV fluids.  Suspect near syncope 2/2 orthostatic hypotension and dehydration. Instructed to drink plenty of fluids. Close follow up with OBGYN. Patient in agreement with plan at time of discharge.   Patient and plan of care discussed with Attending physician, Dr. Jodi Mourning.    Final Clinical Impressions(s) / ED Diagnoses   Final diagnoses:  Near syncope    New Prescriptions Discharge Medication List as of 09/15/2016  5:19 PM       Wynelle Cleveland, MD 09/16/16 Marlyne Beards    Blane Ohara, MD 09/18/16 Claris Pong    Blane Ohara, MD 09/23/16 [id number]

## 2016-09-15 NOTE — ED Notes (Signed)
ED Provider at bedside. 

## 2016-09-15 NOTE — Discharge Instructions (Signed)
Your work up was reassuring today. Please continue to drink plenty of fluids. Follow up with OBGYN as discussed. Return with any concerning symptoms.

## 2016-09-15 NOTE — ED Triage Notes (Addendum)
Pt arrives via [facility] from [facility] after becoming dizzy and falling to the floor striking her face. Pt denies loc. Reports she is [redacted] weeks pregnant with hx of dizziness during this pregnancy, [facility] and told by her OB that she needs to hydrate more to prevent low bp and dizziness. Pt a/ox4, resp e/u, nad.

## 2016-09-18 ENCOUNTER — Telehealth: Payer: Self-pay | Admitting: Family Medicine

## 2016-09-18 NOTE — Telephone Encounter (Signed)
Will forward to MD. Jazmin Hartsell,CMA  

## 2016-09-18 NOTE — Telephone Encounter (Signed)
Pt is calling because she is a patient of Dr. Sydnee Cabal for her pregnancy. She was sent to [facility] for lab work for 09/19/16. They called her today and canceled because she is to far along in her pregnancy. She was told to follow up with her doctor to see what to do next. jw

## 2016-09-19 ENCOUNTER — Other Ambulatory Visit (HOSPITAL_COMMUNITY): Payer: Medicaid Other

## 2016-09-19 ENCOUNTER — Ambulatory Visit (HOSPITAL_COMMUNITY): Admission: RE | Admit: 2016-09-19 | Payer: Medicaid Other | Source: Ambulatory Visit

## 2016-09-23 ENCOUNTER — Ambulatory Visit: Payer: Medicaid Other

## 2016-09-23 ENCOUNTER — Other Ambulatory Visit: Payer: Self-pay | Admitting: Family Medicine

## 2016-09-23 DIAGNOSIS — Z3A16 16 weeks gestation of pregnancy: Secondary | ICD-10-CM

## 2016-09-23 DIAGNOSIS — Z3400 Encounter for supervision of normal first pregnancy, unspecified trimester: Secondary | ICD-10-CM | POA: Insufficient documentation

## 2016-10-01 ENCOUNTER — Telehealth: Payer: Self-pay

## 2016-10-01 NOTE — Telephone Encounter (Signed)
Tried calling office but patient was not on their appointment list for today nor was she in their system.  The receptionist said that she remembered talking to patient and they would see her but she had to have a note first.  Will forward to MD to write this note for patient for tomorrow since he is out of office today and is the only one who saw her for OB care. Meredeth Furber,CMA

## 2016-10-01 NOTE — Telephone Encounter (Signed)
Patient [facility] two [facility].  Patient is in severe pain and is taking Tylenol to no avail.Marland Kitchen. Her appointment is at 1430 on today. The fax number is [phone].  She say that Dr. Sydnee Cabaliallo told her to call back with this information after she found a dentist.Meghan Frederick, Meghan Frederick

## 2016-10-02 ENCOUNTER — Encounter: Payer: Self-pay | Admitting: *Deleted

## 2016-10-02 ENCOUNTER — Encounter: Payer: Self-pay | Admitting: Family Medicine

## 2016-10-02 NOTE — Telephone Encounter (Signed)
Pt called again today about his letter.  She is upset because the letter hasnt been sent.  Please fax the letter today.

## 2016-10-02 NOTE — Telephone Encounter (Signed)
Letter faxed and called patient to let her know. Isayah Ignasiak,CMA

## 2016-10-14 ENCOUNTER — Telehealth: Payer: Self-pay | Admitting: Family Medicine

## 2016-10-14 NOTE — Telephone Encounter (Signed)
Pt wants to know if it is safe to take Tylenol PM while pregnant

## 2016-10-15 ENCOUNTER — Other Ambulatory Visit: Payer: Self-pay | Admitting: Family Medicine

## 2016-10-15 ENCOUNTER — Telehealth: Payer: Self-pay | Admitting: Family Medicine

## 2016-10-15 DIAGNOSIS — Z3492 Encounter for supervision of normal pregnancy, unspecified, second trimester: Secondary | ICD-10-CM

## 2016-10-15 DIAGNOSIS — Z3402 Encounter for supervision of normal first pregnancy, second trimester: Secondary | ICD-10-CM

## 2016-10-15 NOTE — Telephone Encounter (Signed)
Original order incorrect, order changed and appointment scheduled for 9/24 at 145pm (earliest available) at [facility]. Will forward to MD to inform patient.

## 2016-10-15 NOTE — Telephone Encounter (Signed)
Spoke with patient, see other phone note for details Meghan Dodrill, MD

## 2016-10-15 NOTE — Telephone Encounter (Signed)
Pt is calling because she has some questions that only her doctor can answer. She really needs to speak to ASAP. jw

## 2016-10-15 NOTE — Telephone Encounter (Signed)
Called patient. She is frustrated because she was evidently previously told by Dr. Sydnee Cabal that she would definitely be able to find out the sex of her baby at tomorrow's appointment with [place] at the [facility]. Her sister advised that she would need an ultrasound at [facility] to find out the baby's gender, and suggested she call us. She is upset because she wanted to find out baby's gender tomorrow, and says she was previously assured that this would happen during tomorrow's appointment.   Does not appear anatomy scan is presently scheduled. Red team, can you call this AM to schedule her anatomy scan for ASAP? I have put an order in. Once it is scheduled I will call her with the appointment (I promised to personally call her with the info)  Also - she had questions about tylenol PM, which she has taken on occasion for tooth pain. Advised tylenol is safe, and the benadryl component is safe as needed, but not to take it excessively.  Thanks Latrelle Dodrill, MD

## 2016-10-15 NOTE — Telephone Encounter (Signed)
Called patient and informed her of appointment time for ultrasound. She is frustrated to have a separate visit, that she has to keep missing work for appts. I advised that the anatomy scan would be separate from a routine OB visit regardless of whether she was told she'd find out baby's gender tomorrow.  FYI to Dr. Sydnee Cabal  Latrelle Dodrill, MD

## 2016-10-16 ENCOUNTER — Ambulatory Visit (INDEPENDENT_AMBULATORY_CARE_PROVIDER_SITE_OTHER): Payer: Medicaid Other | Admitting: Family Medicine

## 2016-10-16 VITALS — BP 116/70 | HR 99 | Temp 97.8°F | Wt 101.0 lb

## 2016-10-16 DIAGNOSIS — Z3402 Encounter for supervision of normal first pregnancy, second trimester: Secondary | ICD-10-CM

## 2016-10-16 NOTE — Progress Notes (Signed)
Subjective:    Meghan Frederick is a 31 y.o. G1P0 [redacted]w[redacted]d being seen today for her obstetrical visit.  Patient reports no complaints. Fetal movement: normal.  Objective:    BP 116/70   Pulse 99   Temp 97.8 F (36.6 C)   Wt 101 lb (45.8 kg)   LMP 06/05/2016   BMI 17.07 kg/m   Physical Exam  Constitutional: She is oriented to person, place, and time. She appears well-developed.  HENT:  Head: Normocephalic.  Eyes: Pupils are equal, round, and reactive to light.  Neck: Normal range of motion.  Cardiovascular: Normal rate.   Respiratory: Effort normal.  GI: Soft.  Genitourinary: Uterus normal.  Musculoskeletal: Normal range of motion.  Neurological: She is alert and oriented to person, place, and time.  Skin: Skin is warm and dry.    Maternal Exam:  Uterine Assessment: Contraction strength is mild.  Contraction frequency is irregular.   Abdomen: Patient reports no abdominal tenderness. Fundal height is 19 cm.   Fetal presentation: no presenting part  Introitus: not evaluated.   Ferning test: not done.  Nitrazine test: not done. Amniotic fluid character: not assessed.  Cervix: not evaluated.    FHT:  155  Uterine Size:  19 cm  Presentation:  Not evaluated     Assessment:    Pregnancy:  G1P0  No acute complaints. Patient is scheduled for Anatomy scan on 10/21/2016. Discussed importance of hydration, patient with good understanding. Patient is also increasing her caloric intake and has gained weight since last visit. Patient is currently adding Ensure to her diet.   Plan:    Patient Active Problem List   Diagnosis Date Noted  . Pregnancy 09/23/2016  . 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 Well woman exam (no gynecological exam) 03/25/2012  . Right patellofemoral syndrome 03/25/2012  . Pulmonary nodules 12/06/2009  . PLEURISY 12/01/2009  . TOBACCO USER 12/10/2008  . WEIGHT LOSS 02/06/2007  . DEPRESSION, MAJOR, RECURRENT 03/27/2006    Pediatrician: discussed,  patient planning to bring baby to [facility] Infant feeding: plans to breastfeed. Cigarette smoking: quit date 2011. Follow up in 64month.

## 2016-10-16 NOTE — Patient Instructions (Signed)

## 2016-10-18 ENCOUNTER — Encounter: Payer: Self-pay | Admitting: Family Medicine

## 2016-10-21 ENCOUNTER — Ambulatory Visit (HOSPITAL_COMMUNITY)
Admission: RE | Admit: 2016-10-21 | Discharge: 2016-10-21 | Disposition: A | Discharge status: Home / Self Care | Payer: Medicaid Other | Source: Ambulatory Visit | Attending: Family Medicine | Admitting: Family Medicine

## 2016-10-21 ENCOUNTER — Encounter: Payer: Self-pay | Admitting: Family Medicine

## 2016-10-21 ENCOUNTER — Telehealth: Payer: Self-pay | Admitting: Family Medicine

## 2016-10-21 DIAGNOSIS — Z3A19 19 weeks gestation of pregnancy: Secondary | ICD-10-CM | POA: Insufficient documentation

## 2016-10-21 DIAGNOSIS — Z3689 Encounter for other specified antenatal screening: Secondary | ICD-10-CM | POA: Insufficient documentation

## 2016-10-21 DIAGNOSIS — O4400 Placenta previa specified as without hemorrhage, unspecified trimester: Secondary | ICD-10-CM | POA: Insufficient documentation

## 2016-10-21 DIAGNOSIS — Z3492 Encounter for supervision of normal pregnancy, unspecified, second trimester: Secondary | ICD-10-CM

## 2016-10-21 NOTE — Telephone Encounter (Signed)
FYI to Dr. Sydnee Cabal Latrelle Dodrill, MD

## 2016-10-21 NOTE — Telephone Encounter (Signed)
Called patient to discuss anatomy scan results. Major findings are:  1. Incomplete views of cardiac & facial anatomy  2. Posterior placenta previa  Previa will need to be monitored throughout pregnancy. Counseled patient no penetrative sex or sexual activity (including masturbation) that could lead to orgasm. Advised to inform all providers of placenta previa to ensure no cervical checks occur. Will recheck ultrasound in 4 weeks to reassess anatomy and to see if previa is resolving, though anticipate will need to follow previa into third trimester.  Patient aware & appreciative. Discussed with Dr. Shawnie Pons of OB/GYN via phone who agrees with this plan.

## 2016-10-24 ENCOUNTER — Other Ambulatory Visit: Payer: Self-pay

## 2016-10-24 MED ORDER — PRENATAL COMPLETE 14-0.4 MG PO TABS: 2.0000 | ORAL_TABLET | Freq: Every day | ORAL | 12 refills | Status: DC

## 2016-10-24 NOTE — Telephone Encounter (Signed)
Medication sent into pharmacy and patient is aware. Manuela Halbur,CMA

## 2016-10-24 NOTE — Telephone Encounter (Signed)
Patient wants DR. Diallo send prenatal vitamins and is totally out. Send to [facility] on [address].  Please call her when she can pick up.Meghan Frederick

## 2016-10-27 ENCOUNTER — Encounter: Payer: Self-pay | Admitting: Family Medicine

## 2016-10-29 ENCOUNTER — Telehealth: Payer: Self-pay

## 2016-10-29 ENCOUNTER — Other Ambulatory Visit: Payer: Self-pay | Admitting: Family Medicine

## 2016-10-29 MED ORDER — PRENATAL VITAMIN 27-0.8 MG PO TABS: 1.0000 | ORAL_TABLET | Freq: Every day | ORAL | 5 refills | Status: DC

## 2016-10-29 NOTE — Telephone Encounter (Signed)
Patients pharmacy called and wanted to know if the patient's prenatal vitamins are over the counter or should they be changed so that her insurance will cover them.  They can be reached at [phone]. Please call and advise.Meghan Frederick

## 2016-10-29 NOTE — Telephone Encounter (Signed)
Spoke with Marchelle Folks at [facility] and she said that we can change vitamins to prenatal mini and this is covered by insurance.  Verbal ok given for this and will inform provider that this has been changed. Yanni Quiroa,CMA

## 2016-11-07 ENCOUNTER — Encounter: Payer: Self-pay | Admitting: Family Medicine

## 2016-11-13 NOTE — Progress Notes (Signed)
Meghan Frederick is a 31 y.o. G1P0 at 4816w1d here for routine follow up.  She reports no vaginal bleeding, she is feeling baby move. Sometimes having Shimmel bit of clear discharge but denies having fluid leakage. Previously had some cramping that has now resolved with drinking more water. Mood is normal, says she sometimes crys a lot at "stupid things" like commercials but attributes to hormones, otherwise not feeling sad or hopeless with no SI/HI.  See flow sheet for details.  [facility] Feeding: Breastfeeding Delivery: Doula, maybe epidural, still thinking Anatomy scan reviewed, problems are noted - placenta previa, suboptimal views. Female fetus.  A/P: Pregnancy at 6916w1d.  Doing well.   Pregnancy issues include:     1. Placenta previa noted on anatomy scan - follow up ultrasound scheduled for 11/20/16, also to reassess fetal anatomy due to previous suboptimal views.     2. Routine pregnancy care:       - Recommended flu shot, patient declined today. Information given and patient will think about it.       - Results reviewed today for quad screen - normal quad screen.       - Preterm labor precautions reviewed.       - Follow up 4 weeks - will get 1hr GTT, will schedule f/u ultrasound.  Ellwood DenseAlison Rumball, DO PGY-1, [facility] 11/13/2016 8:36 PM

## 2016-11-14 ENCOUNTER — Ambulatory Visit (INDEPENDENT_AMBULATORY_CARE_PROVIDER_SITE_OTHER): Payer: Medicaid Other | Admitting: Family Medicine

## 2016-11-14 VITALS — BP 98/72 | HR 108 | Temp 98.4°F | Wt 108.0 lb

## 2016-11-14 DIAGNOSIS — Z3402 Encounter for supervision of normal first pregnancy, second trimester: Secondary | ICD-10-CM

## 2016-11-14 DIAGNOSIS — O4402 Placenta previa specified as without hemorrhage, second trimester: Secondary | ICD-10-CM

## 2016-11-14 NOTE — Patient Instructions (Addendum)
It was great to see you!  You are [redacted]w[redacted]d with Estimated Date of Delivery: 03/12/17  - We will schedule a follow up ultrasound to check on your placenta and will let you know when that appointment is. - Follow up in 4 weeks for next OB visit - we will do a diabetes test at that visit.  If you have any signs of labor, vaginal bleeding, not feeling baby move, or loss of fluid, go straight to [facility].  Take care and seek immediate care sooner if you develop any concerns.   Ellwood Dense, DO [facility]   Preventing Influenza, Adult Influenza, more commonly known as "the flu," is a viral infection that mainly affects the respiratory tract. The respiratory tract includes structures that help you breathe, such as the lungs, nose, and throat. The flu causes many common cold symptoms, as well as a high fever and body aches. The flu spreads easily from person to person (is contagious). The flu is most common from December through March. This is called flu season.You can catch the flu virus by:  Breathing in droplets from an infected person's cough or sneeze.  Touching something that was recently contaminated with the virus and then touching your mouth, nose, or eyes.  What can I do to lower my risk? You can decrease your risk of getting the flu by:  Getting a flu shot (influenza vaccination) every year. This is the best way to prevent the flu. A flu shot is recommended for everyone age 54 months and older. ? It is best to get a flu shot in the fall, as soon as it is available. Getting a flu shot during winter or spring instead is still a good idea. Flu season can last into early spring. ? Preventing the flu through vaccination requires getting a new flu shot every year. This is because the flu virus changes slightly (mutates) from one year to the next. Even if a flu shot does not completely protect you from all flu virus mutations, it can reduce the severity of your illness and  prevent dangerous complications of the flu. ? If you are pregnant, you can and should get a flu shot. ? If you have had a reaction to the shot in the past or if you are allergic to eggs, check with your health care provider before getting a flu shot. ? Sometimes the vaccine is available as a nasal spray. In some years, the nasal spray has not been as effective against the flu virus. Check with your health care provider if you have questions about this.  Practicing good health habits. This is especially important during flu season. ? Avoid contact with people who are sick with flu or cold symptoms. ? Wash your hands with soap and water often. If soap and water are not available, use hand sanitizer. ? Avoid touching your hands to your face, especially when you have not washed your hands recently. ? Use a disinfectant to clean surfaces at home and at work that may be contaminated with the flu virus. ? Keep your body's disease-fighting system (immune system) in good shape by eating a healthy diet, drinking plenty of fluids, getting enough sleep, and exercising regularly.  If you do get the flu, avoid spreading it to others by:  Staying home until your symptoms have been gone for at least one day.  Covering your mouth and nose with your elbow when you cough or sneeze.  Avoiding close contact with others, especially  babies and elderly people.  Why are these changes important? Getting a flu shot and practicing good health habits protects you as well as other people. If you get the flu, your friends, family, and co-workers are also at risk of getting it, because it spreads so easily to others. Each year, about 2 out of every 10 people get the flu. Having the flu can lead to complications, such as pneumonia, ear infection, and sinus infection. The flu also can be deadly, especially for babies, people older than age 31, and people who have serious long-term diseases. How is this treated? Most people  recover from the flu by resting at home and drinking plenty of fluids. However, a prescription antiviral medicine may reduce your flu symptoms and may make your flu go away sooner. This medicine must be started within a few days of getting flu symptoms. You can talk with your health care provider about whether you need an antiviral medicine. Antiviral medicine may be prescribed for people who are at risk for more serious flu symptoms. This includes people who:  Are older than age 31.  Are pregnant.  Have a condition that makes the flu worse or more dangerous.  Where to find more information:  Centers for Disease Control and Prevention: tsavxtf.comwww.cdc.gov/flu/index.htm  ItsBlog.frFlu.gov: InternetEnthusiasts.huwww.flu.gov/prevention-vaccination  American Academy of Family Physicians: familydoctor.org/familydoctor/en/kids/vaccines/preventing-the-flu.html Contact a health care provider if:  You have influenza and you develop new symptoms.  You have: ? Chest pain. ? Diarrhea. ? A fever.  Your cough gets worse, or you produce more mucus. Summary  The best way to prevent the flu is to get a flu shot every year in the fall.  Even if you get the flu after you have received the yearly vaccine, your flu may be milder and go away sooner because of your flu shot.  If you get the flu, antiviral medicines that are started with a few days of symptoms may reduce your flu symptoms and may make your flu go away sooner.  You can also help prevent the flu by practicing good health habits. This information is not intended to replace advice given to you by your health care provider. Make sure you discuss any questions you have with your health care provider. Document Released: 01/29/2015 Document Revised: 09/23/2015 Document Reviewed: 09/23/2015 Elsevier Interactive Patient Education  2018 [employer]   Second Trimester of Pregnancy The second trimester is from week 14 through week 27 (months 4 through 6). The second trimester is  often a time when you feel your best. Your body has adjusted to being pregnant, and you begin to feel better physically. Usually, morning sickness has lessened or quit completely, you may have more energy, and you may have an increase in appetite. The second trimester is also a time when the fetus is growing rapidly. At the end of the sixth month, the fetus is about 9 inches long and weighs about 1 pounds. You will likely begin to feel the baby move (quickening) between 16 and 20 weeks of pregnancy. Body changes during your second trimester Your body continues to go through many changes during your second trimester. The changes vary from woman to woman.  Your weight will continue to increase. You will notice your lower abdomen bulging out.  You may begin to get stretch marks on your hips, abdomen, and breasts.  You may develop headaches that can be relieved by medicines. The medicines should be approved by your health care provider.  You may urinate more often  because the fetus is pressing on your bladder.  You may develop or continue to have heartburn as a result of your pregnancy.  You may develop constipation because certain hormones are causing the muscles that push waste through your intestines to slow down.  You may develop hemorrhoids or swollen, bulging veins (varicose veins).  You may have back pain. This is caused by: ? Weight gain. ? Pregnancy hormones that are relaxing the joints in your pelvis. ? A shift in weight and the muscles that support your balance.  Your breasts will continue to grow and they will continue to become tender.  Your gums may bleed and may be sensitive to brushing and flossing.  Dark spots or blotches (chloasma, mask of pregnancy) may develop on your face. This will likely fade after the baby is born.  A dark line from your belly button to the pubic area (linea nigra) may appear. This will likely fade after the baby is born.  You may have changes in  your hair. These can include thickening of your hair, rapid growth, and changes in texture. Some women also have hair loss during or after pregnancy, or hair that feels dry or thin. Your hair will most likely return to normal after your baby is born.  What to expect at prenatal visits During a routine prenatal visit:  You will be weighed to make sure you and the fetus are growing normally.  Your blood pressure will be taken.  Your abdomen will be measured to track your baby's growth.  The fetal heartbeat will be listened to.  Any test results from the previous visit will be discussed.  Your health care provider may ask you:  How you are feeling.  If you are feeling the baby move.  If you have had any abnormal symptoms, such as leaking fluid, bleeding, severe headaches, or abdominal cramping.  If you are using any tobacco products, including cigarettes, chewing tobacco, and electronic cigarettes.  If you have any questions.  Other tests that may be performed during your second trimester include:  Blood tests that check for: ? Low iron levels (anemia). ? High blood sugar that affects pregnant women (gestational diabetes) between 13 and 28 weeks. ? Rh antibodies. This is to check for a protein on red blood cells (Rh factor).  Urine tests to check for infections, diabetes, or protein in the urine.  An ultrasound to confirm the proper growth and development of the baby.  An amniocentesis to check for possible genetic problems.  Fetal screens for spina bifida and Down syndrome.  HIV (human immunodeficiency virus) testing. Routine prenatal testing includes screening for HIV, unless you choose not to have this test.  Follow these instructions at home: Medicines  Follow your health care provider's instructions regarding medicine use. Specific medicines may be either safe or unsafe to take during pregnancy.  Take a prenatal vitamin that contains at least 600 micrograms (mcg) of  folic acid.  If you develop constipation, try taking a stool softener if your health care provider approves. Eating and drinking  Eat a balanced diet that includes fresh fruits and vegetables, whole grains, good sources of protein such as meat, eggs, or tofu, and low-fat dairy. Your health care provider will help you determine the amount of weight gain that is right for you.  Avoid raw meat and uncooked cheese. These carry germs that can cause birth defects in the baby.  If you have low calcium intake from food, talk to your health  care provider about whether you should take a daily calcium supplement.  Limit foods that are high in fat and processed sugars, such as fried and sweet foods.  To prevent constipation: ? Drink enough fluid to keep your urine clear or pale yellow. ? Eat foods that are high in fiber, such as fresh fruits and vegetables, whole grains, and beans. Activity  Exercise only as directed by your health care provider. Most women can continue their usual exercise routine during pregnancy. Try to exercise for 30 minutes at least 5 days a week. Stop exercising if you experience uterine contractions.  Avoid heavy lifting, wear low heel shoes, and practice good posture.  A sexual relationship may be continued unless your health care provider directs you otherwise. Relieving pain and discomfort  Wear a good support bra to prevent discomfort from breast tenderness.  Take warm sitz baths to soothe any pain or discomfort caused by hemorrhoids. Use hemorrhoid cream if your health care provider approves.  Rest with your legs elevated if you have leg cramps or low back pain.  If you develop varicose veins, wear support hose. Elevate your feet for 15 minutes, 3-4 times a day. Limit salt in your diet. Prenatal Care  Write down your questions. Take them to your prenatal visits.  Keep all your prenatal visits as told by your health care provider. This is  important. Safety  Wear your seat belt at all times when driving.  Make a list of emergency phone numbers, including numbers for family, friends, the hospital, and police and fire departments. General instructions  Ask your health care provider for a referral to a local prenatal education class. Begin classes no later than the beginning of month 6 of your pregnancy.  Ask for help if you have counseling or nutritional needs during pregnancy. Your health care provider can offer advice or refer you to specialists for help with various needs.  Do not use hot tubs, steam rooms, or saunas.  Do not douche or use tampons or scented sanitary pads.  Do not cross your legs for long periods of time.  Avoid cat litter boxes and soil used by cats. These carry germs that can cause birth defects in the baby and possibly loss of the fetus by miscarriage or stillbirth.  Avoid all smoking, herbs, alcohol, and unprescribed drugs. Chemicals in these products can affect the formation and growth of the baby.  Do not use any products that contain nicotine or tobacco, such as cigarettes and e-cigarettes. If you need help quitting, ask your health care provider.  Visit your dentist if you have not gone yet during your pregnancy. Use a soft toothbrush to brush your teeth and be gentle when you floss. Contact a health care provider if:  You have dizziness.  You have mild pelvic cramps, pelvic pressure, or nagging pain in the abdominal area.  You have persistent nausea, vomiting, or diarrhea.  You have a bad smelling vaginal discharge.  You have pain when you urinate. Get help right away if:  You have a fever.  You are leaking fluid from your vagina.  You have spotting or bleeding from your vagina.  You have severe abdominal cramping or pain.  You have rapid weight gain or weight loss.  You have shortness of breath with chest pain.  You notice sudden or extreme swelling of your face, hands,  ankles, feet, or legs.  You have not felt your baby move in over an hour.  You have severe headaches  that do not go away when you take medicine.  You have vision changes. Summary  The second trimester is from week 14 through week 27 (months 4 through 6). It is also a time when the fetus is growing rapidly.  Your body goes through many changes during pregnancy. The changes vary from woman to woman.  Avoid all smoking, herbs, alcohol, and unprescribed drugs. These chemicals affect the formation and growth your baby.  Do not use any tobacco products, such as cigarettes, chewing tobacco, and e-cigarettes. If you need help quitting, ask your health care provider.  Contact your health care provider if you have any questions. Keep all prenatal visits as told by your health care provider. This is important. This information is not intended to replace advice given to you by your health care provider. Make sure you discuss any questions you have with your health care provider. Document Released: 01/08/2001 Document Revised: 06/22/2015 Document Reviewed: 03/17/2012 Elsevier Interactive Patient Education  2017 [employer].

## 2016-11-20 ENCOUNTER — Ambulatory Visit (HOSPITAL_COMMUNITY)
Admission: RE | Admit: 2016-11-20 | Discharge: 2016-11-20 | Disposition: A | Discharge status: Home / Self Care | Payer: Medicaid Other | Source: Ambulatory Visit | Attending: Family Medicine | Admitting: Family Medicine

## 2016-11-20 ENCOUNTER — Other Ambulatory Visit: Payer: Self-pay | Admitting: Family Medicine

## 2016-11-20 ENCOUNTER — Encounter (HOSPITAL_COMMUNITY): Payer: Self-pay

## 2016-11-20 ENCOUNTER — Encounter: Payer: Self-pay | Admitting: Family Medicine

## 2016-11-20 DIAGNOSIS — O4402 Placenta previa specified as without hemorrhage, second trimester: Secondary | ICD-10-CM

## 2016-11-20 DIAGNOSIS — Z362 Encounter for other antenatal screening follow-up: Secondary | ICD-10-CM | POA: Diagnosis present

## 2016-11-20 DIAGNOSIS — Z3402 Encounter for supervision of normal first pregnancy, second trimester: Secondary | ICD-10-CM

## 2016-11-20 DIAGNOSIS — Z3A24 24 weeks gestation of pregnancy: Secondary | ICD-10-CM

## 2016-11-27 ENCOUNTER — Telehealth: Payer: Self-pay

## 2016-11-27 NOTE — Telephone Encounter (Signed)
Patient needs a letter of confirmation for her pregnancy. She wanted She wanted it today but I told her that Pollie MeyerMcIntyre would not be in clinic until this afternoon.  She wanted to pick it up today.Glennie HawkSimpson, Riad Wagley R

## 2016-11-27 NOTE — Telephone Encounter (Signed)
Pregnancy verification letter placed up front for patient to pick up.

## 2016-12-02 ENCOUNTER — Telehealth: Payer: Self-pay | Admitting: Family Medicine

## 2016-12-02 DIAGNOSIS — Z3402 Encounter for supervision of normal first pregnancy, second trimester: Secondary | ICD-10-CM

## 2016-12-02 NOTE — Telephone Encounter (Signed)
Attempted to reach patient to review ultrasound results. No answer. LVM asking her to call back. Please let me know when she returns the call.  Latrelle DodrillBrittany J McIntyre, MD

## 2016-12-03 ENCOUNTER — Encounter: Payer: Self-pay | Admitting: Family Medicine

## 2016-12-05 NOTE — Telephone Encounter (Signed)
Patient called back and I spoke with her.  Discussed ultrasound results, including resolution of placenta previa, but new finding of baby measuring small. Will schedule follow up ultrasound in 6 weeks as recommended by MFM.  Scheduled next prenatal visit in 1 week with Dr. Nancy MarusMayo, as Dr. Sydnee Cabaliallo did not have any regular appts open that afternoon. She will need her 1 hour GTT that visit.  Red team, can you schedule her ultrasound appointment around 12/5? She prefers an appointment after 3pm.   FYI to Dr. Sydnee Cabaliallo  Thanks! Latrelle DodrillBrittany J Adahlia Stembridge, MD

## 2016-12-05 NOTE — Telephone Encounter (Signed)
Thank you for the update, I will continue to follow.   Meghan NeighboursAbdoulaye Brad Mcgaughy, MD [facility], PGY-2

## 2016-12-06 ENCOUNTER — Encounter: Payer: Self-pay | Admitting: Family Medicine

## 2016-12-06 ENCOUNTER — Other Ambulatory Visit: Payer: Self-pay | Admitting: Family Medicine

## 2016-12-06 DIAGNOSIS — Z3A16 16 weeks gestation of pregnancy: Secondary | ICD-10-CM

## 2016-12-06 MED ORDER — PRENATE PIXIE 10-0.6-0.4-200 MG PO CAPS: 1.0000 | ORAL_CAPSULE | Freq: Every day | ORAL | 5 refills | Status: DC

## 2016-12-11 ENCOUNTER — Other Ambulatory Visit (HOSPITAL_COMMUNITY)
Admission: RE | Admit: 2016-12-11 | Discharge: 2016-12-11 | Disposition: A | Discharge status: Home / Self Care | Payer: Medicaid Other | Source: Ambulatory Visit | Attending: Family Medicine | Admitting: Family Medicine

## 2016-12-11 ENCOUNTER — Other Ambulatory Visit: Payer: Self-pay

## 2016-12-11 ENCOUNTER — Ambulatory Visit (INDEPENDENT_AMBULATORY_CARE_PROVIDER_SITE_OTHER): Payer: Medicaid Other | Admitting: Internal Medicine

## 2016-12-11 VITALS — BP 106/58 | HR 98 | Temp 98.0°F | Wt 116.0 lb

## 2016-12-11 DIAGNOSIS — Z3A27 27 weeks gestation of pregnancy: Secondary | ICD-10-CM | POA: Diagnosis not present

## 2016-12-11 DIAGNOSIS — N898 Other specified noninflammatory disorders of vagina: Secondary | ICD-10-CM | POA: Diagnosis not present

## 2016-12-11 DIAGNOSIS — Z23 Encounter for immunization: Secondary | ICD-10-CM

## 2016-12-11 LAB — POCT 1 HR PRENATAL GLUCOSE: GLUCOSE 1 HR PRENATAL, POC: 114 mg/dL

## 2016-12-11 LAB — POCT WET PREP (WET MOUNT)
CLUE CELLS WET PREP WHIFF POC: NEGATIVE
TRICHOMONAS WET PREP HPF POC: ABSENT
WBC, Wet Prep HPF POC: >20

## 2016-12-11 MED ORDER — MICONAZOLE NITRATE 200 MG VA SUPP: 200.0000 mg | Freq: Every day | VAGINAL | 0 refills | Status: DC

## 2016-12-11 NOTE — Progress Notes (Signed)
Milly Jakobbony D Dolinski is a 31 y.o. G1P0 at 2968w0d here for routine follow up.  She reports vaginal discharge for the last few weeks. The discharge is "off white" in color and is a moderate amount. See flow sheet for details.  A/P: Pregnancy at 4368w0d.  Doing well.   Pregnancy issues include: 1. Placenta previa seen on anatomy US- follow-up US 4 weeks later with posterior placenta but resolution of placenta previa 2. Follow-up US 10/24 with EFW at the 40th percentile and AC at the 16th percentile- recommended follow-up growth US in 6 weeks- this has been scheduled for 01/01/17. 3. Vaginal discharge- has been going on for the last 2-3 weeks. Wet prep with few yeast, so will treat with Miconazole vaginal suppositories x 3 days. Gonorrhea and chlamydia ordered.   Preterm labor and fetal movement precautions reviewed. 1 hour gtt performed today and was normal. Tdap given today. Follow up 2 weeks. Needs CBC, HIV, RPR at that visit.

## 2016-12-11 NOTE — Patient Instructions (Signed)
It was so nice to see you!  I have checked some labs today to see what is causing your discharge. I will call you with these results.  We will see you back in 2 weeks!  -Dr. Nancy MarusMayo

## 2016-12-11 NOTE — Assessment & Plan Note (Signed)
Wet prep with few yeast, so will treat with Miconazole vaginal suppositories x 3 days. Gonorrhea and chlamydia ordered.

## 2016-12-12 ENCOUNTER — Encounter: Payer: Self-pay | Admitting: *Deleted

## 2016-12-12 ENCOUNTER — Telehealth: Payer: Self-pay | Admitting: *Deleted

## 2016-12-12 LAB — CERVICOVAGINAL ANCILLARY ONLY
CHLAMYDIA, DNA PROBE: NEGATIVE
NEISSERIA GONORRHEA: NEGATIVE

## 2016-12-12 NOTE — Telephone Encounter (Signed)
Pt scheduled for an appt already. Deseree Blount, CMA  °

## 2016-12-12 NOTE — Telephone Encounter (Signed)
mychart message sent to patient. Meghan Frederick,CMA  

## 2016-12-12 NOTE — Telephone Encounter (Signed)
-----   Message from Campbell StallKaty Dodd Mayo, MD sent at 12/12/2016  4:15 PM EST ----- Please let Ms. Greenly know that her gonorrhea and chlamydia testing was negative.

## 2016-12-13 ENCOUNTER — Encounter: Payer: Self-pay | Admitting: Internal Medicine

## 2016-12-13 MED ORDER — MICONAZOLE NITRATE 200 MG VA SUPP: 200.0000 mg | Freq: Every day | VAGINAL | 0 refills | Status: DC

## 2016-12-13 NOTE — Telephone Encounter (Signed)
Mychart message received from patient asking that medication be sent to a different pharmacy.  This has been done. Jazmin Hartsell,CMA

## 2016-12-13 NOTE — Addendum Note (Signed)
Addended by: Henri MedalHARTSELL, Shaquan Missey M on: 12/13/2016 04:54 PM   Modules accepted: Orders

## 2016-12-24 ENCOUNTER — Encounter: Payer: Self-pay | Admitting: Family Medicine

## 2017-01-01 ENCOUNTER — Ambulatory Visit (HOSPITAL_COMMUNITY)
Admission: RE | Admit: 2017-01-01 | Discharge: 2017-01-01 | Disposition: A | Discharge status: Home / Self Care | Payer: Medicaid Other | Source: Ambulatory Visit | Attending: Family Medicine | Admitting: Family Medicine

## 2017-01-01 ENCOUNTER — Other Ambulatory Visit: Payer: Self-pay | Admitting: Family Medicine

## 2017-01-01 DIAGNOSIS — Z362 Encounter for other antenatal screening follow-up: Secondary | ICD-10-CM | POA: Insufficient documentation

## 2017-01-01 DIAGNOSIS — O2613 Low weight gain in pregnancy, third trimester: Secondary | ICD-10-CM | POA: Diagnosis not present

## 2017-01-01 DIAGNOSIS — Z3402 Encounter for supervision of normal first pregnancy, second trimester: Secondary | ICD-10-CM

## 2017-01-01 DIAGNOSIS — Z3A30 30 weeks gestation of pregnancy: Secondary | ICD-10-CM

## 2017-01-10 ENCOUNTER — Encounter: Payer: Self-pay | Admitting: Family Medicine

## 2017-01-15 ENCOUNTER — Ambulatory Visit (INDEPENDENT_AMBULATORY_CARE_PROVIDER_SITE_OTHER): Payer: Medicaid Other | Admitting: Family Medicine

## 2017-01-15 ENCOUNTER — Other Ambulatory Visit: Payer: Self-pay

## 2017-01-15 ENCOUNTER — Encounter: Payer: Medicaid Other | Admitting: Family Medicine

## 2017-01-15 VITALS — BP 104/60 | HR 87 | Temp 87.0°F | Wt 128.0 lb

## 2017-01-15 DIAGNOSIS — Z3A32 32 weeks gestation of pregnancy: Secondary | ICD-10-CM

## 2017-01-15 DIAGNOSIS — Z3403 Encounter for supervision of normal first pregnancy, third trimester: Secondary | ICD-10-CM

## 2017-01-15 NOTE — Patient Instructions (Addendum)

## 2017-01-15 NOTE — Progress Notes (Signed)
Milly Jakobbony D Lampert is a 31 y.o. G1P0 at 836w0d here for routine follow up.  She reports fetal movements and denies vaginal bleeding and fluid leakage. See flow sheet for details.  A/P: Pregnancy at 3936w0d.  Doing well.   Pregnancy issues include, previous placenta previa, resolved (posterior).  Infant feeding choice: Breast, Bottle  Contraception choice: Undecided Infant circumcision desired: not applicable  Tdap was not given today.  Preterm labor and fetal movement precautions reviewed. Safe sleep discussed. Follow up 2 weeks.

## 2017-01-16 ENCOUNTER — Encounter: Payer: Medicaid Other | Admitting: Family Medicine

## 2017-01-16 LAB — RPR: RPR: NONREACTIVE

## 2017-01-16 LAB — CBC
Hematocrit: 31 % — ABNORMAL LOW (ref 34.0–46.6)
Hemoglobin: 10.1 g/dL — ABNORMAL LOW (ref 11.1–15.9)
MCH: 28.1 pg (ref 26.6–33.0)
MCHC: 32.6 g/dL (ref 31.5–35.7)
MCV: 86 fL (ref 79–97)
PLATELETS: 215 10*3/uL (ref 150–379)
RBC: 3.6 x10E6/uL — ABNORMAL LOW (ref 3.77–5.28)
RDW: 14 % (ref 12.3–15.4)
WBC: 11.8 10*3/uL — ABNORMAL HIGH (ref 3.4–10.8)

## 2017-01-16 LAB — HIV ANTIBODY (ROUTINE TESTING W REFLEX): HIV SCREEN 4TH GENERATION: NONREACTIVE

## 2017-01-28 NOTE — L&D Delivery Note (Signed)
Delivery Note At 11:26 AM a viable female was delivered via Vaginal, Spontaneous OA -LOA.  APGAR: 8, 9; weight pending Placenta status: intact spontaneous with gentle traction.  Cord: 3V  with the following complications: none.   Anesthesia:   Episiotomy: None Lacerations: Periurethral L repaired, R hemostatic Suture Repair: 3.0 vicryl Est. Blood Loss (mL): 200  Mom to postpartum.  Baby to Couplet care / Skin to Skin.  Luna KitchensKathryn Reegan Bouffard CNM   Please schedule this patient for Postpartum visit in: 6 weeks with the following provider: Any provider For C/S patients schedule nurse incision check in weeks 2 weeks: no High risk pregnancy complicated by: nothing Delivery mode:  SVD Anticipated Birth Control:  Depo PP Procedures needed: pap  Schedule Integrated BH visit: yes

## 2017-01-29 ENCOUNTER — Encounter: Payer: Self-pay | Admitting: Family Medicine

## 2017-01-31 ENCOUNTER — Other Ambulatory Visit: Payer: Self-pay

## 2017-01-31 ENCOUNTER — Encounter: Payer: Self-pay | Admitting: Family Medicine

## 2017-01-31 ENCOUNTER — Ambulatory Visit (INDEPENDENT_AMBULATORY_CARE_PROVIDER_SITE_OTHER): Payer: Medicaid Other | Admitting: Family Medicine

## 2017-01-31 VITALS — BP 110/60 | HR 89 | Temp 89.0°F | Wt 128.0 lb

## 2017-01-31 DIAGNOSIS — Z3403 Encounter for supervision of normal first pregnancy, third trimester: Secondary | ICD-10-CM

## 2017-01-31 NOTE — Patient Instructions (Signed)
Third Trimester of Pregnancy The third trimester is from week 28 through week 40 (months 7 through 9). The third trimester is a time when the unborn baby (fetus) is growing rapidly. At the end of the ninth month, the fetus is about 20 inches in length and weighs 6-10 pounds. Body changes during your third trimester Your body will continue to go through many changes during pregnancy. The changes vary from woman to woman. During the third trimester:  Your weight will continue to increase. You can expect to gain 25-35 pounds (11-16 kg) by the end of the pregnancy.  You may begin to get stretch marks on your hips, abdomen, and breasts.  You may urinate more often because the fetus is moving lower into your pelvis and pressing on your bladder.  You may develop or continue to have heartburn. This is caused by increased hormones that slow down muscles in the digestive tract.  You may develop or continue to have constipation because increased hormones slow digestion and cause the muscles that push waste through your intestines to relax.  You may develop hemorrhoids. These are swollen veins (varicose veins) in the rectum that can itch or be painful.  You may develop swollen, bulging veins (varicose veins) in your legs.  You may have increased body aches in the pelvis, back, or thighs. This is due to weight gain and increased hormones that are relaxing your joints.  You may have changes in your hair. These can include thickening of your hair, rapid growth, and changes in texture. Some women also have hair loss during or after pregnancy, or hair that feels dry or thin. Your hair will most likely return to normal after your baby is born.  Your breasts will continue to grow and they will continue to become tender. A yellow fluid (colostrum) may leak from your breasts. This is the first milk you are producing for your baby.  Your belly button may stick out.  You may notice more swelling in your hands,  face, or ankles.  You may have increased tingling or numbness in your hands, arms, and legs. The skin on your belly may also feel numb.  You may feel short of breath because of your expanding uterus.  You may have more problems sleeping. This can be caused by the size of your belly, increased need to urinate, and an increase in your body's metabolism.  You may notice the fetus "dropping," or moving lower in your abdomen (lightening).  You may have increased vaginal discharge.  You may notice your joints feel loose and you may have pain around your pelvic bone.  What to expect at prenatal visits You will have prenatal exams every 2 weeks until week 36. Then you will have weekly prenatal exams. During a routine prenatal visit:  You will be weighed to make sure you and the baby are growing normally.  Your blood pressure will be taken.  Your abdomen will be measured to track your baby's growth.  The fetal heartbeat will be listened to.  Any test results from the previous visit will be discussed.  You may have a cervical check near your due date to see if your cervix has softened or thinned (effaced).  You will be tested for Group B streptococcus. This happens between 35 and 37 weeks.  Your health care provider may ask you:  What your birth plan is.  How you are feeling.  If you are feeling the baby move.  If you have had   any abnormal symptoms, such as leaking fluid, bleeding, severe headaches, or abdominal cramping.  If you are using any tobacco products, including cigarettes, chewing tobacco, and electronic cigarettes.  If you have any questions.  Other tests or screenings that may be performed during your third trimester include:  Blood tests that check for low iron levels (anemia).  Fetal testing to check the health, activity level, and growth of the fetus. Testing is done if you have certain medical conditions or if there are problems during the  pregnancy.  Nonstress test (NST). This test checks the health of your baby to make sure there are no signs of problems, such as the baby not getting enough oxygen. During this test, a belt is placed around your belly. The baby is made to move, and its heart rate is monitored during movement.  What is false labor? False labor is a condition in which you feel small, irregular tightenings of the muscles in the womb (contractions) that usually go away with rest, changing position, or drinking water. These are called Braxton Hicks contractions. Contractions may last for hours, days, or even weeks before true labor sets in. If contractions come at regular intervals, become more frequent, increase in intensity, or become painful, you should see your health care provider. What are the signs of labor?  Abdominal cramps.  Regular contractions that start at 10 minutes apart and become stronger and more frequent with time.  Contractions that start on the top of the uterus and spread down to the lower abdomen and back.  Increased pelvic pressure and dull back pain.  A watery or bloody mucus discharge that comes from the vagina.  Leaking of amniotic fluid. This is also known as your "water breaking." It could be a slow trickle or a gush. Let your health care provider know if it has a color or strange odor. If you have any of these signs, call your health care provider right away, even if it is before your due date. Follow these instructions at home: Medicines  Follow your health care provider's instructions regarding medicine use. Specific medicines may be either safe or unsafe to take during pregnancy.  Take a prenatal vitamin that contains at least 600 micrograms (mcg) of folic acid.  If you develop constipation, try taking a stool softener if your health care provider approves. Eating and drinking  Eat a balanced diet that includes fresh fruits and vegetables, whole grains, good sources of protein  such as meat, eggs, or tofu, and low-fat dairy. Your health care provider will help you determine the amount of weight gain that is right for you.  Avoid raw meat and uncooked cheese. These carry germs that can cause birth defects in the baby.  If you have low calcium intake from food, talk to your health care provider about whether you should take a daily calcium supplement.  Eat four or five small meals rather than three large meals a day.  Limit foods that are high in fat and processed sugars, such as fried and sweet foods.  To prevent constipation: ? Drink enough fluid to keep your urine clear or pale yellow. ? Eat foods that are high in fiber, such as fresh fruits and vegetables, whole grains, and beans. Activity  Exercise only as directed by your health care provider. Most women can continue their usual exercise routine during pregnancy. Try to exercise for 30 minutes at least 5 days a week. Stop exercising if you experience uterine contractions.  Avoid heavy   lifting.  Do not exercise in extreme heat or humidity, or at high altitudes.  Wear low-heel, comfortable shoes.  Practice good posture.  You may continue to have sex unless your health care provider tells you otherwise. Relieving pain and discomfort  Take frequent breaks and rest with your legs elevated if you have leg cramps or low back pain.  Take warm sitz baths to soothe any pain or discomfort caused by hemorrhoids. Use hemorrhoid cream if your health care provider approves.  Wear a good support bra to prevent discomfort from breast tenderness.  If you develop varicose veins: ? Wear support pantyhose or compression stockings as told by your healthcare provider. ? Elevate your feet for 15 minutes, 3-4 times a day. Prenatal care  Write down your questions. Take them to your prenatal visits.  Keep all your prenatal visits as told by your health care provider. This is important. Safety  Wear your seat belt at  all times when driving.  Make a list of emergency phone numbers, including numbers for family, friends, the hospital, and police and fire departments. General instructions  Avoid cat litter boxes and soil used by cats. These carry germs that can cause birth defects in the baby. If you have a cat, ask someone to clean the litter box for you.  Do not travel far distances unless it is absolutely necessary and only with the approval of your health care provider.  Do not use hot tubs, steam rooms, or saunas.  Do not drink alcohol.  Do not use any products that contain nicotine or tobacco, such as cigarettes and e-cigarettes. If you need help quitting, ask your health care provider.  Do not use any medicinal herbs or unprescribed drugs. These chemicals affect the formation and growth of the baby.  Do not douche or use tampons or scented sanitary pads.  Do not cross your legs for long periods of time.  To prepare for the arrival of your baby: ? Take prenatal classes to understand, practice, and ask questions about labor and delivery. ? Make a trial run to the hospital. ? Visit the hospital and tour the maternity area. ? Arrange for maternity or paternity leave through employers. ? Arrange for family and friends to take care of pets while you are in the hospital. ? Purchase a rear-facing car seat and make sure you know how to install it in your car. ? Pack your hospital bag. ? Prepare the baby's nursery. Make sure to remove all pillows and stuffed animals from the baby's crib to prevent suffocation.  Visit your dentist if you have not gone during your pregnancy. Use a soft toothbrush to brush your teeth and be gentle when you floss. Contact a health care provider if:  You are unsure if you are in labor or if your water has broken.  You become dizzy.  You have mild pelvic cramps, pelvic pressure, or nagging pain in your abdominal area.  You have lower back pain.  You have persistent  nausea, vomiting, or diarrhea.  You have an unusual or bad smelling vaginal discharge.  You have pain when you urinate. Get help right away if:  Your water breaks before 37 weeks.  You have regular contractions less than 5 minutes apart before 37 weeks.  You have a fever.  You are leaking fluid from your vagina.  You have spotting or bleeding from your vagina.  You have severe abdominal pain or cramping.  You have rapid weight loss or weight gain.    You have shortness of breath with chest pain.  You notice sudden or extreme swelling of your face, hands, ankles, feet, or legs.  Your baby makes fewer than 10 movements in 2 hours.  You have severe headaches that do not go away when you take medicine.  You have vision changes. Summary  The third trimester is from week 28 through week 40, months 7 through 9. The third trimester is a time when the unborn baby (fetus) is growing rapidly.  During the third trimester, your discomfort may increase as you and your baby continue to gain weight. You may have abdominal, leg, and back pain, sleeping problems, and an increased need to urinate.  During the third trimester your breasts will keep growing and they will continue to become tender. A yellow fluid (colostrum) may leak from your breasts. This is the first milk you are producing for your baby.  False labor is a condition in which you feel small, irregular tightenings of the muscles in the womb (contractions) that eventually go away. These are called Braxton Hicks contractions. Contractions may last for hours, days, or even weeks before true labor sets in.  Signs of labor can include: abdominal cramps; regular contractions that start at 10 minutes apart and become stronger and more frequent with time; watery or bloody mucus discharge that comes from the vagina; increased pelvic pressure and dull back pain; and leaking of amniotic fluid. This information is not intended to replace advice  given to you by your health care provider. Make sure you discuss any questions you have with your health care provider. Document Released: 01/08/2001 Document Revised: 06/22/2015 Document Reviewed: 03/17/2012 [employer] Interactive Patient Education  2017 [employer]  Breastfeeding Choosing to breastfeed is one of the best decisions you can make for yourself and your baby. A change in hormones during pregnancy causes your breasts to make breast milk in your milk-producing glands. Hormones prevent breast milk from being released before your baby is born. They also prompt milk flow after birth. Once breastfeeding has begun, thoughts of your baby, as well as his or her sucking or crying, can stimulate the release of milk from your milk-producing glands. Benefits of breastfeeding Research shows that breastfeeding offers many health benefits for infants and mothers. It also offers a cost-free and convenient way to feed your baby. For your baby  Your first milk (colostrum) helps your baby's digestive system to function better.  Special cells in your milk (antibodies) help your baby to fight off infections.  Breastfed babies are less likely to develop asthma, allergies, obesity, or type 2 diabetes. They are also at lower risk for sudden infant death syndrome (SIDS).  Nutrients in breast milk are better able to meet your baby's needs compared to infant formula.  Breast milk improves your baby's brain development. For you  Breastfeeding helps to create a very special bond between you and your baby.  Breastfeeding is convenient. Breast milk costs nothing and is always available at the correct temperature.  Breastfeeding helps to burn calories. It helps you to lose the weight that you gained during pregnancy.  Breastfeeding makes your uterus return faster to its size before pregnancy. It also slows bleeding (lochia) after you give birth.  Breastfeeding helps to lower your risk of developing type 2  diabetes, osteoporosis, rheumatoid arthritis, cardiovascular disease, and breast, ovarian, uterine, and endometrial cancer later in life. Breastfeeding basics Starting breastfeeding  Find a comfortable place to sit or lie down, with your neck and back   well-supported.  Place a pillow or a rolled-up blanket under your baby to bring him or her to the level of your breast (if you are seated). Nursing pillows are specially designed to help support your arms and your baby while you breastfeed.  Make sure that your baby's tummy (abdomen) is facing your abdomen.  Gently massage your breast. With your fingertips, massage from the outer edges of your breast inward toward the nipple. This encourages milk flow. If your milk flows slowly, you may need to continue this action during the feeding.  Support your breast with 4 fingers underneath and your thumb above your nipple (make the letter "C" with your hand). Make sure your fingers are well away from your nipple and your baby's mouth.  Stroke your baby's lips gently with your finger or nipple.  When your baby's mouth is open wide enough, quickly bring your baby to your breast, placing your entire nipple and as much of the areola as possible into your baby's mouth. The areola is the colored area around your nipple. ? More areola should be visible above your baby's upper lip than below the lower lip. ? Your baby's lips should be opened and extended outward (flanged) to ensure an adequate, comfortable latch. ? Your baby's tongue should be between his or her lower gum and your breast.  Make sure that your baby's mouth is correctly positioned around your nipple (latched). Your baby's lips should create a seal on your breast and be turned out (everted).  It is common for your baby to suck about 2-3 minutes in order to start the flow of breast milk. Latching Teaching your baby how to latch onto your breast properly is very important. An improper latch can  cause nipple pain, decreased milk supply, and poor weight gain in your baby. Also, if your baby is not latched onto your nipple properly, he or she may swallow some air during feeding. This can make your baby fussy. Burping your baby when you switch breasts during the feeding can help to get rid of the air. However, teaching your baby to latch on properly is still the best way to prevent fussiness from swallowing air while breastfeeding. Signs that your baby has successfully latched onto your nipple  Silent tugging or silent sucking, without causing you pain. Infant's lips should be extended outward (flanged).  Swallowing heard between every 3-4 sucks once your milk has started to flow (after your let-down milk reflex occurs).  Muscle movement above and in front of his or her ears while sucking.  Signs that your baby has not successfully latched onto your nipple  Sucking sounds or smacking sounds from your baby while breastfeeding.  Nipple pain.  If you think your baby has not latched on correctly, slip your finger into the corner of your baby's mouth to break the suction and place it between your baby's gums. Attempt to start breastfeeding again. Signs of successful breastfeeding Signs from your baby  Your baby will gradually decrease the number of sucks or will completely stop sucking.  Your baby will fall asleep.  Your baby's body will relax.  Your baby will retain a small amount of milk in his or her mouth.  Your baby will let go of your breast by himself or herself.  Signs from you  Breasts that have increased in firmness, weight, and size 1-3 hours after feeding.  Breasts that are softer immediately after breastfeeding.  Increased milk volume, as well as a change in milk   consistency and color by the fifth day of breastfeeding.  Nipples that are not sore, cracked, or bleeding.  Signs that your baby is getting enough milk  Wetting at least 1-2 diapers during the first 24  hours after birth.  Wetting at least 5-6 diapers every 24 hours for the first week after birth. The urine should be clear or pale yellow by the age of 5 days.  Wetting 6-8 diapers every 24 hours as your baby continues to grow and develop.  At least 3 stools in a 24-hour period by the age of 5 days. The stool should be soft and yellow.  At least 3 stools in a 24-hour period by the age of 7 days. The stool should be seedy and yellow.  No loss of weight greater than 10% of birth weight during the first 3 days of life.  Average weight gain of 4-7 oz (113-198 g) per week after the age of 4 days.  Consistent daily weight gain by the age of 5 days, without weight loss after the age of 2 weeks. After a feeding, your baby may spit up a small amount of milk. This is normal. Breastfeeding frequency and duration Frequent feeding will help you make more milk and can prevent sore nipples and extremely full breasts (breast engorgement). Breastfeed when you feel the need to reduce the fullness of your breasts or when your baby shows signs of hunger. This is called "breastfeeding on demand." Signs that your baby is hungry include:  Increased alertness, activity, or restlessness.  Movement of the head from side to side.  Opening of the mouth when the corner of the mouth or cheek is stroked (rooting).  Increased sucking sounds, smacking lips, cooing, sighing, or squeaking.  Hand-to-mouth movements and sucking on fingers or hands.  Fussing or crying.  Avoid introducing a pacifier to your baby in the first 4-6 weeks after your baby is born. After this time, you may choose to use a pacifier. Research has shown that pacifier use during the first year of a baby's life decreases the risk of sudden infant death syndrome (SIDS). Allow your baby to feed on each breast as long as he or she wants. When your baby unlatches or falls asleep while feeding from the first breast, offer the second breast. Because  newborns are often sleepy in the first few weeks of life, you may need to awaken your baby to get him or her to feed. Breastfeeding times will vary from baby to baby. However, the following rules can serve as a guide to help you make sure that your baby is properly fed:  Newborns (babies 4 weeks of age or younger) may breastfeed every 1-3 hours.  Newborns should not go without breastfeeding for longer than 3 hours during the day or 5 hours during the night.  You should breastfeed your baby a minimum of 8 times in a 24-hour period.  Breast milk pumping Pumping and storing breast milk allows you to make sure that your baby is exclusively fed your breast milk, even at times when you are unable to breastfeed. This is especially important if you go back to work while you are still breastfeeding, or if you are not able to be present during feedings. Your lactation consultant can help you find a method of pumping that works best for you and give you guidelines about how long it is safe to store breast milk. Caring for your breasts while you breastfeed Nipples can become dry, cracked, and   sore while breastfeeding. The following recommendations can help keep your breasts moisturized and healthy:  Avoid using soap on your nipples.  Wear a supportive bra designed especially for nursing. Avoid wearing underwire-style bras or extremely tight bras (sports bras).  Air-dry your nipples for 3-4 minutes after each feeding.  Use only cotton bra pads to absorb leaked breast milk. Leaking of breast milk between feedings is normal.  Use lanolin on your nipples after breastfeeding. Lanolin helps to maintain your skin's normal moisture barrier. Pure lanolin is not harmful (not toxic) to your baby. You may also hand express a few drops of breast milk and gently massage that milk into your nipples and allow the milk to air-dry.  In the first few weeks after giving birth, some women experience breast engorgement.  Engorgement can make your breasts feel heavy, warm, and tender to the touch. Engorgement peaks within 3-5 days after you give birth. The following recommendations can help to ease engorgement:  Completely empty your breasts while breastfeeding or pumping. You may want to start by applying warm, moist heat (in the shower or with warm, water-soaked hand towels) just before feeding or pumping. This increases circulation and helps the milk flow. If your baby does not completely empty your breasts while breastfeeding, pump any extra milk after he or she is finished.  Apply ice packs to your breasts immediately after breastfeeding or pumping, unless this is too uncomfortable for you. To do this: ? Put ice in a plastic bag. ? Place a towel between your skin and the bag. ? Leave the ice on for 20 minutes, 2-3 times a day.  Make sure that your baby is latched on and positioned properly while breastfeeding.  If engorgement persists after 48 hours of following these recommendations, contact your health care provider or a lactation consultant. Overall health care recommendations while breastfeeding  Eat 3 healthy meals and 3 snacks every day. Well-nourished mothers who are breastfeeding need an additional 450-500 calories a day. You can meet this requirement by increasing the amount of a balanced diet that you eat.  Drink enough water to keep your urine pale yellow or clear.  Rest often, relax, and continue to take your prenatal vitamins to prevent fatigue, stress, and low vitamin and mineral levels in your body (nutrient deficiencies).  Do not use any products that contain nicotine or tobacco, such as cigarettes and e-cigarettes. Your baby may be harmed by chemicals from cigarettes that pass into breast milk and exposure to secondhand smoke. If you need help quitting, ask your health care provider.  Avoid alcohol.  Do not use illegal drugs or marijuana.  Talk with your health care provider before  taking any medicines. These include over-the-counter and prescription medicines as well as vitamins and herbal supplements. Some medicines that may be harmful to your baby can pass through breast milk.  It is possible to become pregnant while breastfeeding. If birth control is desired, ask your health care provider about options that will be safe while breastfeeding your baby. Where to find more information: [employer]: www.llli.org Contact a health care provider if:  You feel like you want to stop breastfeeding or have become frustrated with breastfeeding.  Your nipples are cracked or bleeding.  Your breasts are red, tender, or warm.  You have: ? Painful breasts or nipples. ? A swollen area on either breast. ? A fever or chills. ? Nausea or vomiting. ? Drainage other than breast milk from your nipples.  Your   breasts do not become full before feedings by the fifth day after you give birth.  You feel sad and depressed.  Your baby is: ? Too sleepy to eat well. ? Having trouble sleeping. ? More than 1 week old and wetting fewer than 6 diapers in a 24-hour period. ? Not gaining weight by 5 days of age.  Your baby has fewer than 3 stools in a 24-hour period.  Your baby's skin or the white parts of his or her eyes become yellow. Get help right away if:  Your baby is overly tired (lethargic) and does not want to wake up and feed.  Your baby develops an unexplained fever. Summary  Breastfeeding offers many health benefits for infant and mothers.  Try to breastfeed your infant when he or she shows early signs of hunger.  Gently tickle or stroke your baby's lips with your finger or nipple to allow the baby to open his or her mouth. Bring the baby to your breast. Make sure that much of the areola is in your baby's mouth. Offer one side and burp the baby before you offer the other side.  Talk with your health care provider or lactation consultant if you have  questions or you face problems as you breastfeed. This information is not intended to replace advice given to you by your health care provider. Make sure you discuss any questions you have with your health care provider. Document Released: 01/14/2005 Document Revised: 02/16/2016 Document Reviewed: 02/16/2016 Elsevier Interactive Patient Education  2018 Elsevier Inc.  

## 2017-01-31 NOTE — Progress Notes (Signed)
Subjective:    Meghan Frederick is a 32 y.o. G1P0 8335w2d being seen today for her obstetrical visit.  Patient reports no bleeding and no leaking. Fetal movement: normal.  Objective:    BP 110/60   Pulse 89   Temp (!) 89 F (31.7 C)   Wt 128 lb (58.1 kg)   LMP 06/05/2016   BMI 21.63 kg/m    Physical Exam  Maternal Exam:  Abdomen: Patient reports no abdominal tenderness. Fetal presentation: vertex  Introitus: not evaluated.   Ferning test: not done.  Nitrazine test: not done. Amniotic fluid character: not assessed.  Pelvis: adequate for delivery.   Cervix: not evaluated.    FHT:  150  Uterine Size:  33  Presentation:  Vertex     Assessment:    Pregnancy:  G1P0. Patient is doing well no acute complaints.   Pregnancy issues include, previous placenta previa, resolved (posterior).   Plan:    Patient Active Problem List   Diagnosis Date Noted  . Vaginal discharge 12/11/2016  . Placenta previa 10/21/2016  . Pregnancy 09/23/2016  . 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 Well woman exam (no gynecological exam) 03/25/2012  . Right patellofemoral syndrome 03/25/2012  . Pulmonary nodules 12/06/2009  . PLEURISY 12/01/2009  . TOBACCO USER 12/10/2008  . WEIGHT LOSS 02/06/2007  . DEPRESSION, MAJOR, RECURRENT 03/27/2006    Infant feeding: plans to breastfeed, plans to bottle feed. Follow up in 2 Weeks.

## 2017-02-03 ENCOUNTER — Encounter: Payer: Self-pay | Admitting: Family Medicine

## 2017-02-03 DIAGNOSIS — Z3A16 16 weeks gestation of pregnancy: Secondary | ICD-10-CM

## 2017-02-03 MED ORDER — PRENATE PIXIE 10-0.6-0.4-200 MG PO CAPS: 1.0000 | ORAL_CAPSULE | Freq: Every day | ORAL | 5 refills | Status: DC

## 2017-02-14 ENCOUNTER — Other Ambulatory Visit (HOSPITAL_COMMUNITY)
Admission: RE | Admit: 2017-02-14 | Discharge: 2017-02-14 | Disposition: A | Discharge status: Home / Self Care | Payer: Medicaid Other | Source: Ambulatory Visit | Attending: Family Medicine | Admitting: Family Medicine

## 2017-02-14 ENCOUNTER — Ambulatory Visit (INDEPENDENT_AMBULATORY_CARE_PROVIDER_SITE_OTHER): Payer: Medicaid Other | Admitting: Internal Medicine

## 2017-02-14 ENCOUNTER — Other Ambulatory Visit: Payer: Self-pay

## 2017-02-14 VITALS — BP 108/60 | HR 95 | Temp 98.1°F | Wt 131.0 lb

## 2017-02-14 DIAGNOSIS — Z3A36 36 weeks gestation of pregnancy: Secondary | ICD-10-CM

## 2017-02-14 DIAGNOSIS — Z3403 Encounter for supervision of normal first pregnancy, third trimester: Secondary | ICD-10-CM | POA: Insufficient documentation

## 2017-02-14 LAB — OB RESULTS CONSOLE GBS: GBS: POSITIVE

## 2017-02-14 LAB — OB RESULTS CONSOLE GC/CHLAMYDIA: Gonorrhea: NEGATIVE

## 2017-02-14 NOTE — Patient Instructions (Signed)
It was so nice to see you!  Everything looks great today. We will see you back in 1 week!  -Dr. Nancy MarusMayo

## 2017-02-15 NOTE — Progress Notes (Signed)
Meghan Frederick is a 32 y.o. G1P0 at 1627w3d here for routine follow up.  She reports good fetal movement and pelvic pressure. No contractions, no vaginal bleeding, no leakage of fluids. See flow sheet for details.  A/P: Pregnancy at 4227w3d.  Doing well.   Pregnancy issues include: 1. Posterior placenta previa seen on anatomic US- resolved on f/u US 4 weeks later 2. Underweight fetus- US 10/24 with EFW at the 40th %tile and AC at the 16th %tile; follow-up growth US 6 weeks later with EFW at the 49th %ile and AC at the 46th %ile.  Tdap was given 12/11/16. GBS and gc/chlamydia testing was performed today.  Preterm labor and fetal movement precautions reviewed. Safe sleep discussed. Follow up 1 week.

## 2017-02-17 ENCOUNTER — Encounter: Payer: Self-pay | Admitting: Student

## 2017-02-17 LAB — CERVICOVAGINAL ANCILLARY ONLY
Chlamydia: NEGATIVE
Neisseria Gonorrhea: NEGATIVE

## 2017-02-17 LAB — CULTURE, BETA STREP (GROUP B ONLY): Strep Gp B Culture: POSITIVE — AB

## 2017-02-18 ENCOUNTER — Ambulatory Visit (INDEPENDENT_AMBULATORY_CARE_PROVIDER_SITE_OTHER): Payer: Medicaid Other | Admitting: Student

## 2017-02-18 ENCOUNTER — Encounter: Payer: Self-pay | Admitting: Student

## 2017-02-18 DIAGNOSIS — Z3403 Encounter for supervision of normal first pregnancy, third trimester: Secondary | ICD-10-CM

## 2017-02-18 DIAGNOSIS — O26843 Uterine size-date discrepancy, third trimester: Secondary | ICD-10-CM

## 2017-02-18 NOTE — Progress Notes (Signed)
   Subjective:    Meghan Frederick is a G1P0 4426w6d being seen today for her first obstetrical visit.  Her obstetrical history is significant for group B strep colonizer. Patient does intend to breast feed. Pregnancy history fully reviewed. She plans on natural delivery & working on getting a doula. [facility]  She is a transfer from [facility] & has had routine visits up until last week.   Patient reports no complaints.  Vitals:   02/18/17 1100  BP: 120/73  Pulse: 93  Weight: 133 lb 9.6 oz (60.6 kg)    HISTORY: OB History  Gravida Para Term Preterm AB Living  1            SAB TAB Ectopic Multiple Live Births               # Outcome Date GA Lbr Len/2nd Weight Sex Delivery Anes PTL Lv  1 Current              Past Medical History:  Diagnosis Date  . Medical history non-contributory    Past Surgical History:  Procedure Laterality Date  . I&D EXTREMITY Right    Middle finger   Family History  Problem Relation Age of Onset  . Cancer Mother        lung  . Cancer Father        prostate     Exam    Uterus:  Fundal Height: 34 cm   Skin: normal coloration and turgor, no rashes    Neurologic: oriented, normal mood   Extremities: normal strength, tone, and muscle mass   Cardiovascular: regular rate and rhythm, no murmurs or gallops   Respiratory:  appears well, vitals normal, no respiratory distress, acyanotic, normal RR, chest clear, no wheezing, crepitations, rhonchi, normal symmetric air entry   Abdomen: soft, non-tender; bowel sounds normal; no masses,  no organomegaly      Assessment:    Pregnancy: G1P0 Patient Active Problem List   Diagnosis Date Noted  . Uterine size date discrepancy pregnancy, third trimester 02/18/2017  . Supervision of normal first pregnancy 09/23/2016  . 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 Right patellofemoral syndrome 03/25/2012  . Pulmonary nodules 12/06/2009  . PLEURISY 12/01/2009  .  TOBACCO USER 12/10/2008  . DEPRESSION, MAJOR, RECURRENT 03/27/2006        Plan:  1. Encounter for supervision of normal first pregnancy in third trimester - doing well -discussed results of GBS & will plan for PCN during labor  2. Uterine size date discrepancy pregnancy, third trimester -FH 34 wks at 3626w6d. Reassess in 1 wk & consider growth ultrasound if necessary  Return in 1 wk for routine prenatal visit   Judeth Hornrin Gareth Fitzner 02/18/2017

## 2017-02-18 NOTE — Patient Instructions (Addendum)
Childbirth Education Options: [facility] Classes:  Childbirth education classes can help you get ready for a positive parenting experience. You can also meet other expectant parents and get free stuff for your baby. Each class runs for five weeks on the same night and costs $45 for the mother-to-be and her support person. Medicaid covers the cost if you are eligible. Call [phone] to register. [facility] Childbirth Education:  [phone] or [phone] or [email or web address]  Baby & Me Class: Discuss newborn & infant parenting and family adjustment issues with other new mothers in a relaxed environment. Each week brings a new speaker or baby-centered activity. We encourage new mothers to join us every Thursday at 11:00am. Babies birth until crawling. No registration or fee. Daddy Boot Camp: This course offers Dads-to-be the tools and knowledge needed to feel confident on their journey to becoming new fathers. Experienced dads, who have been trained as coaches, teach dads-to-be how to hold, comfort, diaper, swaddle and play with their infant while being able to support the new mom as well. A class for men taught by men. $25/dad Big Brother/Big Sister: Let your children share in the joy of a new brother or sister in this special class designed just for them. Class includes discussion about how families care for babies: swaddling, holding, diapering, safety as well as how they can be helpful in their new role. This class is designed for children ages 2 to 6, but any age is welcome. Please register each child individually. $5/child  Mom Talk: This mom-led group offers support and connection to mothers as they journey through the adjustments and struggles of that sometimes overwhelming first year after the birth of a child. Tuesdays at 10:00am and Thursdays at 6:00pm. Babies welcome. No registration or fee. Breastfeeding Support Group: This group is a mother-to-mother  support circle where moms have the opportunity to share their breastfeeding experiences. A Lactation Consultant is present for questions and concerns. Meets each Tuesday at 11:00am. No fee or registration. Breastfeeding Your Baby: Learn what to expect in the first days of breastfeeding your newborn.  This class will help you feel more confident with the skills needed to begin your breastfeeding experience. Many new mothers are concerned about breastfeeding after leaving the hospital. This class will also address the most common fears and challenges about breastfeeding during the first few weeks, months and beyond. (call for fee) Comfort Techniques and Tour: This 2 hour interactive class will provide you the opportunity to learn & practice hands-on techniques that can help relieve some of the discomfort of labor and encourage your baby to rotate toward the best position for birth. You and your partner will be able to try a variety of labor positions with birth balls and rebozos as well as practice breathing, relaxation, and visualization techniques. A tour of the [facility] is included with this class. $20 per registrant and support person Childbirth Class- Weekend Option: This class is a Weekend version of our Birth & Baby series. It is designed for parents who have a difficult time fitting several weeks of classes into their schedule. It covers the care of your newborn and the basics of labor and childbirth. It also includes a Maternity Care Center Tour of [facility] and lunch. The class is held two consecutive days: beginning on Friday evening from 6:30 - 8:30 p.m. and the next day, Saturday from 9 a.m. - 4 p.m. (call for fee) Waterbirth Class: Interested in a waterbirth?  This   informational class will help you discover whether waterbirth is the right fit for you. Education about waterbirth itself, supplies you would need and how to assemble your support team is what you can  expect from this class. Some obstetrical practices require this class in order to pursue a waterbirth. (Not all obstetrical practices offer waterbirth-check with your healthcare provider.) Register only the expectant mom, but you are encouraged to bring your partner to class! Required if planning waterbirth, no fee. Infant/Child CPR: Parents, grandparents, babysitters, and friends learn Cardio-Pulmonary Resuscitation skills for infants and children. You will also learn how to treat both conscious and unconscious choking in infants and children. This Family & Friends program does not offer certification. Register each participant individually to ensure that enough mannequins are available. (Call for fee) Grandparent Love: Expecting a grandbaby? This class is for you! Learn about the latest infant care and safety recommendations and ways to support your own child as he or she transitions into the parenting role. Taught by Registered Nurses who are childbirth instructors, but most importantly...they are grandmothers too! $10/person. Childbirth Class- Natural Childbirth: This series of 5 weekly classes is for expectant parents who want to learn and practice natural methods of coping with the process of labor and childbirth. Relaxation, breathing, massage, visualization, role of the partner, and helpful positioning are highlighted. Participants learn how to be confident in their body's ability to give birth. This class will empower and help parents make informed decisions about their own care. Includes discussion that will help new parents transition into the immediate postpartum period. Maternity Care Center Tour of [facility] is included. We suggest taking this class between 25-32 weeks, but it's only a recommendation. $75 per registrant and one support person or $30 Medicaid. Childbirth Class- 3 week Series: This option of 3 weekly classes helps you and your labor partner prepare for childbirth. Newborn  care, labor & birth, cesarean birth, pain management, and comfort techniques are discussed and a Maternity Care Center Tour of [facility] is included. The class meets at the same time, on the same day of the week for 3 consecutive weeks beginning with the starting date you choose. $60 for registrant and one support person.  Marvelous Multiples: Expecting twins, triplets, or more? This class covers the differences in labor, birth, parenting, and breastfeeding issues that face multiples' parents. NICU tour is included. Led by a Certified Childbirth Educator who is the mother of twins. No fee. Caring for Baby: This class is for expectant and adoptive parents who want to learn and practice the most up-to-date newborn care for their babies. Focus is on birth through the first six weeks of life. Topics include feeding, bathing, diapering, crying, umbilical cord care, circumcision care and safe sleep. Parents learn to recognize symptoms of illness and when to call the pediatrician. Register only the mom-to-be and your partner or support person can plan to come with you! $10 per registrant and support person Childbirth Class- online option: This online class offers you the freedom to complete a Birth and Baby series in the comfort of your own home. The flexibility of this option allows you to review sections at your own pace, at times convenient to you and your support people. It includes additional video information, animations, quizzes, and extended activities. Get organized with helpful eClass tools, checklists, and trackers. Once you register online for the class, you will receive an email within a few days to accept the invitation and begin the class when the time   is right for you. The content will be available to you for 60 days. $60 for 60 days of online access for you and your support people.  Local Doulas: Natural Baby Doulas [email or web address] Tel:  [phone] [email or web address] [employer] [phone] [email or web address] The Labor Merla RichesLadies  (also do waterbirth tub rental) [phone] [email or web address] [facility] Doula [phone] [email or web address] [facility]  [phone] [email or web address] [employer]oul[employer] (PADA) [email or web address] [place] Birth and Baby  [email or web address]      Third Trimester of Pregnancy The third trimester is from week 29 through week 42, months 7 through 9. This trimester is when your unborn baby (fetus) is growing very fast. At the end of the ninth month, the unborn baby is about 20 inches in length. It weighs about 6-10 pounds. Follow these instructions at home:  Avoid all smoking, herbs, and alcohol. Avoid drugs not approved by your doctor.  Do not use any tobacco products, including cigarettes, chewing tobacco, and electronic cigarettes. If you need help quitting, ask your doctor. You may get counseling or other support to help you quit.  Only take medicine as told by your doctor. Some medicines are safe and some are not during pregnancy.  Exercise only as told by your doctor. Stop exercising if you start having cramps.  Eat regular, healthy meals.  Wear a good support bra if your breasts are tender.  Do not use hot tubs, steam rooms, or saunas.  Wear your seat belt when driving.  Avoid raw meat, uncooked cheese, and liter boxes and soil used by cats.  Take your prenatal vitamins.  Take 1500-2000 milligrams of calcium daily starting at the 20th week of pregnancy until you deliver your baby.  Try taking medicine that helps you poop (stool softener) as needed, and if your doctor approves. Eat more fiber by eating fresh fruit, vegetables, and whole grains. Drink enough fluids  to keep your pee (urine) clear or pale yellow.  Take warm water baths (sitz baths) to soothe pain or discomfort caused by hemorrhoids. Use hemorrhoid cream if your doctor approves.  If you have puffy, bulging veins (varicose veins), wear support hose. Raise (elevate) your feet for 15 minutes, 3-4 times a day. Limit salt in your diet.  Avoid heavy lifting, wear low heels, and sit up straight.  Rest with your legs raised if you have leg cramps or low back pain.  Visit your dentist if you have not gone during your pregnancy. Use a soft toothbrush to brush your teeth. Be gentle when you floss.  You can have sex (intercourse) unless your doctor tells you not to.  Do not travel far distances unless you must. Only do so with your doctor's approval.  Take prenatal classes.  Practice driving to the hospital.  Pack your hospital bag.  Prepare the baby's room.  Go to your doctor visits. Get help if:  You are not sure if you are in labor or if your water has broken.  You are dizzy.  You have mild cramps or pressure in your lower belly (abdominal).  You have a nagging pain in your belly area.  You continue to feel sick to your stomach (nauseous), throw up (vomit), or have watery poop (diarrhea).  You have bad smelling fluid coming from your vagina.  You have pain with peeing (urination). Get help right away if:  You have a fever.  You are leaking fluid from your vagina.  You are spotting or bleeding from your vagina.  You have  severe belly cramping or pain.  You lose or gain weight rapidly.  You have trouble catching your breath and have chest pain.  You notice sudden or extreme puffiness (swelling) of your face, hands, ankles, feet, or legs.  You have not felt the baby move in over an hour.  You have severe headaches that do not go away with medicine.  You have vision changes. This information is not intended to replace advice given to you by your health care  provider. Make sure you discuss any questions you have with your health care provider. Document Released: 04/10/2009 Document Revised: 06/22/2015 Document Reviewed: 03/17/2012 Elsevier Interactive Patient Education  2017 Elsevier [employer].     AREA PEDIATRIC/FAMILY PRACTICE PHYSICIANS  [facility] [address] [place], [place]  [address] Phone - [phone]   Fax - [phone]  [facility] [address] [place], [place] [address] Phone - [phone]   Fax - [phone]  [facility]ACK AMOS [address] [place], [place]  [address] Phone - [phone]   Fax - [phone]  [facility] [address] [place], [place]  [address] Phone - [phone]   Fax - [phone]  [facility] [address] [place], [place]  [address] Phone - [phone]   Fax - [phone]  [facility] [address], [address] [place], [place]  [address] Phone - [phone]   Fax - [phone]  [facility] [place] [address] [place], [place]  [address] Phone - [phone]   Fax - [phone]  [facility] AT [facility] [address] [place], [address] [place], [place]  [address] Phone - [phone]   Fax - [phone]  [facility] AT [facility] [address] [place], [place]  [address] Phone - [phone]   Fax - [phone] [facility] AT [place] [address] [place], [place]  [address] Phone - [phone]   Fax - [phone]  [facility] [address] [place], [place]  [address] Phone - [phone]   Fax - [phone]  [facility] [address], [address] [place], [place]  [address] Phone - [phone]   Fax - [phone]  [facility] [place] [address] [place], [place]  [address] Phone - [phone]   Fax -  [phone]  [facility] [address] [place], [place]  [address] Phone - [phone]  [facility] [address] [place], [place]  [address] Phone - [phone]   Fax - [phone]  [facility] [address] [place], [place]  [address] Phone - [phone]   Fax - [phone]  [facility] [address] [place], [place]  [address] Phone - [phone]   Fax - [phone]  [facility] [address] [place], [place]  [address] Phone - [phone]   Fax - [phone]   [facility] [address] [place], [place]  [address] Phone - [phone]   Fax - [phone]  [facility] [address] [place], [place]  [address] Phone - [phone]   Fax - [phone]  DAVID RUBIN [address] [place], [place]  [address] Phone - [phone]   Fax - [phone]  [facility] [address] [place], [place]  [address] Phone - [phone]   Fax - [phone]  [facility] -  [place] [address] [place], [place]  [address] Phone - [phone]   Fax - [phone] Gerarda Fraction [phone] [address], [place]  [address] Phone - [phone]   Fax - [phone]  [facility] [address] [place], [place]  [address] Phone - [phone]   Fax - [phone]  [facility] - [place] [address] [place], [place]  [address] Phone - [phone]   Fax - [phone]  [facility] -  Wyvonne Lenz MD [address] [place] [address] Phone [phone]  Fax [phone]

## 2017-02-25 ENCOUNTER — Encounter: Payer: Medicaid Other | Admitting: Advanced Practice Midwife

## 2017-02-25 ENCOUNTER — Ambulatory Visit (INDEPENDENT_AMBULATORY_CARE_PROVIDER_SITE_OTHER): Payer: Medicaid Other | Admitting: Student

## 2017-02-25 ENCOUNTER — Encounter (HOSPITAL_COMMUNITY): Payer: Self-pay

## 2017-02-25 ENCOUNTER — Encounter: Payer: Self-pay | Admitting: Advanced Practice Midwife

## 2017-02-25 ENCOUNTER — Inpatient Hospital Stay (HOSPITAL_COMMUNITY)
Admission: AD | Admit: 2017-02-25 | Discharge: 2017-02-25 | Disposition: A | Discharge status: Home / Self Care | Payer: Medicaid Other | Source: Ambulatory Visit | Attending: Obstetrics and Gynecology | Admitting: Obstetrics and Gynecology

## 2017-02-25 VITALS — BP 132/71 | HR 86 | Wt 136.0 lb

## 2017-02-25 DIAGNOSIS — Z3A38 38 weeks gestation of pregnancy: Secondary | ICD-10-CM

## 2017-02-25 DIAGNOSIS — Z3403 Encounter for supervision of normal first pregnancy, third trimester: Secondary | ICD-10-CM

## 2017-02-25 DIAGNOSIS — W182XXA Fall in (into) shower or empty bathtub, initial encounter: Secondary | ICD-10-CM | POA: Insufficient documentation

## 2017-02-25 DIAGNOSIS — Z3A37 37 weeks gestation of pregnancy: Secondary | ICD-10-CM | POA: Diagnosis not present

## 2017-02-25 DIAGNOSIS — O26843 Uterine size-date discrepancy, third trimester: Secondary | ICD-10-CM

## 2017-02-25 DIAGNOSIS — O471 False labor at or after 37 completed weeks of gestation: Secondary | ICD-10-CM | POA: Diagnosis not present

## 2017-02-25 DIAGNOSIS — Z87891 Personal history of nicotine dependence: Secondary | ICD-10-CM | POA: Insufficient documentation

## 2017-02-25 DIAGNOSIS — W19XXXA Unspecified fall, initial encounter: Secondary | ICD-10-CM

## 2017-02-25 DIAGNOSIS — O368130 Decreased fetal movements, third trimester, not applicable or unspecified: Secondary | ICD-10-CM | POA: Diagnosis not present

## 2017-02-25 LAB — WET PREP, GENITAL
Clue Cells Wet Prep HPF POC: NONE SEEN
Sperm: NONE SEEN
Trich, Wet Prep: NONE SEEN
Yeast Wet Prep HPF POC: NONE SEEN

## 2017-02-25 LAB — POCT FERN TEST: POCT Fern Test: NEGATIVE

## 2017-02-25 LAB — AMNISURE RUPTURE OF MEMBRANE (ROM) NOT AT ARMC: Amnisure ROM: NEGATIVE

## 2017-02-25 NOTE — Progress Notes (Signed)
Patient reports falling in shower 2 days ago. States she fell pretty hard and hit her back. Patient reports small amount of bleeding that started after she fell & watery discharge. Patient reports decreased fetal movement since then. Patient also reports contractions & a lot of vaginal/pelvic pain.

## 2017-02-25 NOTE — MAU Note (Signed)
Urine in lab 

## 2017-02-25 NOTE — MAU Note (Signed)
Pt is a G1P0 at 37.6 weeks s/p a fall on back 2 days ago.  After pt reports small amount of pink bleeding x1 after using the bathroom.  Pt also reports LOF for 1 week and decreased FM.  Resolved placenta previa, no other OB concerns.

## 2017-02-25 NOTE — Patient Instructions (Signed)
Third Trimester of Pregnancy The third trimester is from week 28 through week 40 (months 7 through 9). The third trimester is a time when the unborn baby (fetus) is growing rapidly. At the end of the ninth month, the fetus is about 20 inches in length and weighs 6-10 pounds. Body changes during your third trimester Your body will continue to go through many changes during pregnancy. The changes vary from woman to woman. During the third trimester:  Your weight will continue to increase. You can expect to gain 25-35 pounds (11-16 kg) by the end of the pregnancy.  You may begin to get stretch marks on your hips, abdomen, and breasts.  You may urinate more often because the fetus is moving lower into your pelvis and pressing on your bladder.  You may develop or continue to have heartburn. This is caused by increased hormones that slow down muscles in the digestive tract.  You may develop or continue to have constipation because increased hormones slow digestion and cause the muscles that push waste through your intestines to relax.  You may develop hemorrhoids. These are swollen veins (varicose veins) in the rectum that can itch or be painful.  You may develop swollen, bulging veins (varicose veins) in your legs.  You may have increased body aches in the pelvis, back, or thighs. This is due to weight gain and increased hormones that are relaxing your joints.  You may have changes in your hair. These can include thickening of your hair, rapid growth, and changes in texture. Some women also have hair loss during or after pregnancy, or hair that feels dry or thin. Your hair will most likely return to normal after your baby is born.  Your breasts will continue to grow and they will continue to become tender. A yellow fluid (colostrum) may leak from your breasts. This is the first milk you are producing for your baby.  Your belly button may stick out.  You may notice more swelling in your hands,  face, or ankles.  You may have increased tingling or numbness in your hands, arms, and legs. The skin on your belly may also feel numb.  You may feel short of breath because of your expanding uterus.  You may have more problems sleeping. This can be caused by the size of your belly, increased need to urinate, and an increase in your body's metabolism.  You may notice the fetus "dropping," or moving lower in your abdomen (lightening).  You may have increased vaginal discharge.  You may notice your joints feel loose and you may have pain around your pelvic bone.  What to expect at prenatal visits You will have prenatal exams every 2 weeks until week 36. Then you will have weekly prenatal exams. During a routine prenatal visit:  You will be weighed to make sure you and the baby are growing normally.  Your blood pressure will be taken.  Your abdomen will be measured to track your baby's growth.  The fetal heartbeat will be listened to.  Any test results from the previous visit will be discussed.  You may have a cervical check near your due date to see if your cervix has softened or thinned (effaced).  You will be tested for Group B streptococcus. This happens between 35 and 37 weeks.  Your health care provider may ask you:  What your birth plan is.  How you are feeling.  If you are feeling the baby move.  If you have had   any abnormal symptoms, such as leaking fluid, bleeding, severe headaches, or abdominal cramping.  If you are using any tobacco products, including cigarettes, chewing tobacco, and electronic cigarettes.  If you have any questions.  Other tests or screenings that may be performed during your third trimester include:  Blood tests that check for low iron levels (anemia).  Fetal testing to check the health, activity level, and growth of the fetus. Testing is done if you have certain medical conditions or if there are problems during the  pregnancy.  Nonstress test (NST). This test checks the health of your baby to make sure there are no signs of problems, such as the baby not getting enough oxygen. During this test, a belt is placed around your belly. The baby is made to move, and its heart rate is monitored during movement.  What is false labor? False labor is a condition in which you feel small, irregular tightenings of the muscles in the womb (contractions) that usually go away with rest, changing position, or drinking water. These are called Braxton Hicks contractions. Contractions may last for hours, days, or even weeks before true labor sets in. If contractions come at regular intervals, become more frequent, increase in intensity, or become painful, you should see your health care provider. What are the signs of labor?  Abdominal cramps.  Regular contractions that start at 10 minutes apart and become stronger and more frequent with time.  Contractions that start on the top of the uterus and spread down to the lower abdomen and back.  Increased pelvic pressure and dull back pain.  A watery or bloody mucus discharge that comes from the vagina.  Leaking of amniotic fluid. This is also known as your "water breaking." It could be a slow trickle or a gush. Let your health care provider know if it has a color or strange odor. If you have any of these signs, call your health care provider right away, even if it is before your due date. Follow these instructions at home: Medicines  Follow your health care provider's instructions regarding medicine use. Specific medicines may be either safe or unsafe to take during pregnancy.  Take a prenatal vitamin that contains at least 600 micrograms (mcg) of folic acid.  If you develop constipation, try taking a stool softener if your health care provider approves. Eating and drinking  Eat a balanced diet that includes fresh fruits and vegetables, whole grains, good sources of protein  such as meat, eggs, or tofu, and low-fat dairy. Your health care provider will help you determine the amount of weight gain that is right for you.  Avoid raw meat and uncooked cheese. These carry germs that can cause birth defects in the baby.  If you have low calcium intake from food, talk to your health care provider about whether you should take a daily calcium supplement.  Eat four or five small meals rather than three large meals a day.  Limit foods that are high in fat and processed sugars, such as fried and sweet foods.  To prevent constipation: ? Drink enough fluid to keep your urine clear or pale yellow. ? Eat foods that are high in fiber, such as fresh fruits and vegetables, whole grains, and beans. Activity  Exercise only as directed by your health care provider. Most women can continue their usual exercise routine during pregnancy. Try to exercise for 30 minutes at least 5 days a week. Stop exercising if you experience uterine contractions.  Avoid heavy   lifting.  Do not exercise in extreme heat or humidity, or at high altitudes.  Wear low-heel, comfortable shoes.  Practice good posture.  You may continue to have sex unless your health care provider tells you otherwise. Relieving pain and discomfort  Take frequent breaks and rest with your legs elevated if you have leg cramps or low back pain.  Take warm sitz baths to soothe any pain or discomfort caused by hemorrhoids. Use hemorrhoid cream if your health care provider approves.  Wear a good support bra to prevent discomfort from breast tenderness.  If you develop varicose veins: ? Wear support pantyhose or compression stockings as told by your healthcare provider. ? Elevate your feet for 15 minutes, 3-4 times a day. Prenatal care  Write down your questions. Take them to your prenatal visits.  Keep all your prenatal visits as told by your health care provider. This is important. Safety  Wear your seat belt at  all times when driving.  Make a list of emergency phone numbers, including numbers for family, friends, the hospital, and police and fire departments. General instructions  Avoid cat litter boxes and soil used by cats. These carry germs that can cause birth defects in the baby. If you have a cat, ask someone to clean the litter box for you.  Do not travel far distances unless it is absolutely necessary and only with the approval of your health care provider.  Do not use hot tubs, steam rooms, or saunas.  Do not drink alcohol.  Do not use any products that contain nicotine or tobacco, such as cigarettes and e-cigarettes. If you need help quitting, ask your health care provider.  Do not use any medicinal herbs or unprescribed drugs. These chemicals affect the formation and growth of the baby.  Do not douche or use tampons or scented sanitary pads.  Do not cross your legs for long periods of time.  To prepare for the arrival of your baby: ? Take prenatal classes to understand, practice, and ask questions about labor and delivery. ? Make a trial run to the hospital. ? Visit the hospital and tour the maternity area. ? Arrange for maternity or paternity leave through employers. ? Arrange for family and friends to take care of pets while you are in the hospital. ? Purchase a rear-facing car seat and make sure you know how to install it in your car. ? Pack your hospital bag. ? Prepare the baby's nursery. Make sure to remove all pillows and stuffed animals from the baby's crib to prevent suffocation.  Visit your dentist if you have not gone during your pregnancy. Use a soft toothbrush to brush your teeth and be gentle when you floss. Contact a health care provider if:  You are unsure if you are in labor or if your water has broken.  You become dizzy.  You have mild pelvic cramps, pelvic pressure, or nagging pain in your abdominal area.  You have lower back pain.  You have persistent  nausea, vomiting, or diarrhea.  You have an unusual or bad smelling vaginal discharge.  You have pain when you urinate. Get help right away if:  Your water breaks before 37 weeks.  You have regular contractions less than 5 minutes apart before 37 weeks.  You have a fever.  You are leaking fluid from your vagina.  You have spotting or bleeding from your vagina.  You have severe abdominal pain or cramping.  You have rapid weight loss or weight gain.    You have shortness of breath with chest pain.  You notice sudden or extreme swelling of your face, hands, ankles, feet, or legs.  Your baby makes fewer than 10 movements in 2 hours.  You have severe headaches that do not go away when you take medicine.  You have vision changes. Summary  The third trimester is from week 28 through week 40, months 7 through 9. The third trimester is a time when the unborn baby (fetus) is growing rapidly.  During the third trimester, your discomfort may increase as you and your baby continue to gain weight. You may have abdominal, leg, and back pain, sleeping problems, and an increased need to urinate.  During the third trimester your breasts will keep growing and they will continue to become tender. A yellow fluid (colostrum) may leak from your breasts. This is the first milk you are producing for your baby.  False labor is a condition in which you feel small, irregular tightenings of the muscles in the womb (contractions) that eventually go away. These are called Braxton Hicks contractions. Contractions may last for hours, days, or even weeks before true labor sets in.  Signs of labor can include: abdominal cramps; regular contractions that start at 10 minutes apart and become stronger and more frequent with time; watery or bloody mucus discharge that comes from the vagina; increased pelvic pressure and dull back pain; and leaking of amniotic fluid. This information is not intended to replace advice  given to you by your health care provider. Make sure you discuss any questions you have with your health care provider. Document Released: 01/08/2001 Document Revised: 06/22/2015 Document Reviewed: 03/17/2012 Elsevier Interactive Patient Education  2017 [employer]  

## 2017-02-25 NOTE — Discharge Instructions (Signed)
Braxton Hicks Contractions °Contractions of the uterus can occur throughout pregnancy, but they are not always a sign that you are in labor. You may have practice contractions called Braxton Hicks contractions. These false labor contractions are sometimes confused with true labor. °What are Braxton Hicks contractions? °Braxton Hicks contractions are tightening movements that occur in the muscles of the uterus before labor. Unlike true labor contractions, these contractions do not result in opening (dilation) and thinning of the cervix. Toward the end of pregnancy (32-34 weeks), Braxton Hicks contractions can happen more often and may become stronger. These contractions are sometimes difficult to tell apart from true labor because they can be very uncomfortable. You should not feel embarrassed if you go to the hospital with false labor. °Sometimes, the only way to tell if you are in true labor is for your health care provider to look for changes in the cervix. The health care provider will do a physical exam and may monitor your contractions. If you are not in true labor, the exam should show that your cervix is not dilating and your water has not broken. °If there are other health problems associated with your pregnancy, it is completely safe for you to be sent home with false labor. You may continue to have Braxton Hicks contractions until you go into true labor. °How to tell the difference between true labor and false labor °True labor °· Contractions last 30-70 seconds. °· Contractions become very regular. °· Discomfort is usually felt in the top of the uterus, and it spreads to the lower abdomen and low back. °· Contractions do not go away with walking. °· Contractions usually become more intense and increase in frequency. °· The cervix dilates and gets thinner. °False labor °· Contractions are usually shorter and not as strong as true labor contractions. °· Contractions are usually irregular. °· Contractions  are often felt in the front of the lower abdomen and in the groin. °· Contractions may go away when you walk around or change positions while lying down. °· Contractions get weaker and are shorter-lasting as time goes on. °· The cervix usually does not dilate or become thin. °Follow these instructions at home: °· Take over-the-counter and prescription medicines only as told by your health care provider. °· Keep up with your usual exercises and follow other instructions from your health care provider. °· Eat and drink lightly if you think you are going into labor. °· If Braxton Hicks contractions are making you uncomfortable: °? Change your position from lying down or resting to walking, or change from walking to resting. °? Sit and rest in a tub of warm water. °? Drink enough fluid to keep your urine pale yellow. Dehydration may cause these contractions. °? Do slow and deep breathing several times an hour. °· Keep all follow-up prenatal visits as told by your health care provider. This is important. °Contact a health care provider if: °· You have a fever. °· You have continuous pain in your abdomen. °Get help right away if: °· Your contractions become stronger, more regular, and closer together. °· You have fluid leaking or gushing from your vagina. °· You pass blood-tinged mucus (bloody show). °· You have bleeding from your vagina. °· You have low back pain that you never had before. °· You feel your baby’s head pushing down and causing pelvic pressure. °· Your baby is not moving inside you as much as it used to. °Summary °· Contractions that occur before labor are called Braxton   Hicks contractions, false labor, or practice contractions. °· Braxton Hicks contractions are usually shorter, weaker, farther apart, and less regular than true labor contractions. True labor contractions usually become progressively stronger and regular and they become more frequent. °· Manage discomfort from Braxton Hicks contractions by  changing position, resting in a warm bath, drinking plenty of water, or practicing deep breathing. °This information is not intended to replace advice given to you by your health care provider. Make sure you discuss any questions you have with your health care provider. °Document Released: 05/30/2016 Document Revised: 05/30/2016 Document Reviewed: 05/30/2016 °Elsevier Interactive Patient Education © 2018 [employer] ° °

## 2017-02-25 NOTE — MAU Provider Note (Signed)
Chief Complaint:  Decreased Fetal Movement   First Provider Initiated Contact with Patient 02/25/17 1249     HPI: Meghan Frederick is a 32 y.o. G1P0 at [id number] presents to maternity admissions sent up from the clinic who is reporting decreased fetal movement since fall in shower 2 days ago and possible rupture. She reports that she has been possibly leaking since last week, report white thin discharge with underwear being wet up until yesterday. She denies underwear being wet or discharge today. She report falling in the shower on her back two days ago, had some spotting the following day when she started having contractions but no bleeding currently. Since the fall she has felt movement but reports decreased movement and "wanting to check to make sure baby is okay". Currently feels normal fetal movement once monitors were placed. She reports occasional contractions for weeks, reports that it got worse directly after the fall but has spaced out since fall that is why she waited until her office visit today to come in. Currently reports contraction pain as 7/10- reports breathing through some contractions, has not taken pain medication. She denies vaginal itching/burning, urinary symptoms, h/a, dizziness, n/v, or fever/chills.    Past Medical History: Past Medical History:  Diagnosis Date  . Medical history non-contributory     Past obstetric history: OB History  Gravida Para Term Preterm AB Living  1            SAB TAB Ectopic Multiple Live Births               # Outcome Date GA Lbr Len/2nd Weight Sex Delivery Anes PTL Lv  1 Current               Past Surgical History: Past Surgical History:  Procedure Laterality Date  . I&D EXTREMITY Right    Middle finger    Family History: Family History  Problem Relation Age of Onset  . Cancer Mother        lung  . Cancer Father        prostate    Social History: Social History   Tobacco Use  . Smoking status: Former Smoker    Last  attempt to quit: 01/28/2009    Years since quitting: 8.0  . Smokeless tobacco: Never Used  . Tobacco comment: 2011  Substance Use Topics  . Alcohol use: Yes    Comment: social, stopped prior to learning of preg  . Drug use: No    Allergies:  Allergies  Allergen Reactions  . Dust Mite Extract Itching and Other (See Comments)    Runny nose, sore throat  . Pollen Extract Itching    Meds:  Medications Prior to Admission  Medication Sig Dispense Refill Last Dose  . Prenatal Vit-Fe Fumarate-FA (PRENATAL VITAMIN) 27-0.8 MG TABS Take 1 tablet by mouth daily. 30 tablet 5 Taking    ROS:  Review of Systems  Constitutional: Negative.   Respiratory: Negative.   Cardiovascular: Negative.   Gastrointestinal: Positive for abdominal pain. Negative for constipation, diarrhea, nausea and vomiting.  Genitourinary: Positive for vaginal bleeding and vaginal discharge. Negative for difficulty urinating, dysuria, frequency and urgency.       None currently  Musculoskeletal: Negative.   Neurological: Negative.   Psychiatric/Behavioral: Negative.    I have reviewed patient's Past Medical Hx, Surgical Hx, Family Hx, Social Hx, medications and allergies.   Physical Exam   Patient Vitals for the past 24 hrs:  BP Temp Temp src Pulse Resp  02/25/17 1427 128/80 98.2 F (36.8 C) Oral 83 18  02/25/17 1236 - 98.2 F (36.8 C) Oral - 17  02/25/17 1233 124/80 - - 92 -   Constitutional: Well-developed, well-nourished female in no acute distress.  Cardiovascular: normal rate Respiratory: normal effort GI: Abd soft, non-tender, gravid appropriate for gestational age.  MS: Extremities nontender, no edema, normal ROM Neurologic: Alert and oriented x 4.  GU: Neg CVAT.  STERILE SPECULUM EXAM: Cervix pink without lesion, scant white creamy discharge, vaginal walls and external genitalia normal  Cervical Exam: Dilation: 1 Effacement (%): Thick Cervical Position: Posterior Station: -3 Presentation:  Vertex Exam by:: Lanice Shirts CNM  FHT:  Baseline 135 , moderate variability, accelerations present, no decelerations Contractions: 3-5 minutes/mild-moderate   Labs: Results for orders placed or performed during the hospital encounter of 02/25/17 (from the past 24 hour(s))  Wet prep, genital     Status: Abnormal   Collection Time: 02/25/17  1:03 PM  Result Value Ref Range   Yeast Wet Prep HPF POC NONE SEEN NONE SEEN   Trich, Wet Prep NONE SEEN NONE SEEN   Clue Cells Wet Prep HPF POC NONE SEEN NONE SEEN   WBC, Wet Prep HPF POC FEW (A) NONE SEEN   Sperm NONE SEEN   Fern Test     Status: None   Collection Time: 02/25/17  1:24 PM  Result Value Ref Range   POCT Fern Test Negative = intact amniotic membranes   Amnisure rupture of membrane (rom)not at [facility]     Status: None   Collection Time: 02/25/17  1:47 PM  Result Value Ref Range   Amnisure ROM NEGATIVE    O/Positive/-- (08/14 0959)  MAU Course/MDM: Orders Placed This Encounter  Procedures  . Wet prep, genital  . Fern Test  [employer] prep- negative, likely normal discharge during pregnancy  Fern- negative  Amnisure -negative   NST reviewed- reactive  Pt discharge with strict labor precautions. Discussed preventing falls during pregnancy and importance of return to MAU directly after occurrence of fall. Follow up as scheduled and return to MAU as needed for emergencies. Patient is actively feeling movement- discussed fetal kick counts when movement is of concern.    Assessment: 1. Decreased fetal movements in third trimester, single or unspecified fetus   2. False labor after 37 completed weeks of gestation   3. Fall, initial encounter     Plan: Discharge home Labor precautions and fetal kick counts Follow up as scheduled for prenatal appointments  Return to MAU as needed    Allergies as of 02/25/2017      Reactions   Dust Mite Extract Itching, Other (See Comments)   Runny nose, sore throat   Pollen Extract Itching       Medication List    TAKE these medications   Prenatal Vitamin 27-0.8 MG Tabs Take 1 tablet by mouth daily.       Steward Drone Certified Nurse-Midwife 02/25/2017 2:43 PM

## 2017-02-25 NOTE — Progress Notes (Signed)
   PRENATAL VISIT NOTE  Subjective:  Meghan Frederick is a 32 y.o. G1P0 at 3811w6d being seen today for ongoing prenatal care.  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  Patient reports backache, occasional contractions, decreased fetal movement and ?LOF. Reports that she fell 2 days ago. Since the fall she has had back pain, vaginal bleeding, LOF, & decreased fetal movement. States initially the bleeding was spotting & that has resolved. Has noted thin/watery clear discharge since the fall. Did not come in for evaluation after the fall. Contractions: Irritability. Vag. Bleeding: Small.  Movement: (!) Decreased. Denies leaking of fluid.   The following portions of the patient's history were reviewed and updated as appropriate: allergies, current medications, past family history, past medical history, past social history, past surgical history and problem list. Problem list updated.  Objective:   Vitals:   02/25/17 1158  BP: 132/71  Pulse: 86  Weight: 136 lb (61.7 kg)    Fetal Status: Fetal Heart Rate (bpm): NST   Movement: (!) Decreased     General:  Alert, oriented and cooperative. Patient is in no acute distress.  Skin: Skin is warm and dry. No rash noted.   Cardiovascular: Normal heart rate noted  Respiratory: Normal respiratory effort, no problems with respiration noted  Abdomen: Soft, gravid, appropriate for gestational age.  Pain/Pressure: Present     Pelvic: Cervical exam deferred        Extremities: Normal range of motion.  Edema: None  Mental Status:  Normal mood and affect. Normal behavior. Normal judgment and thought content.   Assessment and Plan:  Pregnancy: G1P0 at 3011w6d  1.  Encounter for supervision of normal first pregnancy in third trimester - escorted to MAU for evaluation or ?SROM & fetal monitoring. S/w Steward DroneVeronica Rogers CNM.   Term labor symptoms and general obstetric precautions including but not limited to vaginal bleeding, contractions, leaking of fluid and fetal movement were reviewed in detail with the patient. Please refer to After Visit Summary for other counseling recommendations.  Return in about 1 week (around 03/04/2017).   Judeth HornErin Ariadne Rissmiller, NP

## 2017-02-25 NOTE — Progress Notes (Signed)
Entered in error

## 2017-03-04 ENCOUNTER — Ambulatory Visit (INDEPENDENT_AMBULATORY_CARE_PROVIDER_SITE_OTHER): Payer: Medicaid Other | Admitting: Student

## 2017-03-04 VITALS — BP 110/70 | HR 109 | Wt 138.0 lb

## 2017-03-04 DIAGNOSIS — Z0371 Encounter for suspected problem with amniotic cavity and membrane ruled out: Secondary | ICD-10-CM | POA: Diagnosis not present

## 2017-03-04 DIAGNOSIS — Z3403 Encounter for supervision of normal first pregnancy, third trimester: Secondary | ICD-10-CM | POA: Diagnosis not present

## 2017-03-04 NOTE — Progress Notes (Signed)
   PRENATAL VISIT NOTE  Subjective:  Meghan Frederick is a 32 y.o. G1P0 at 7868w6d being seen today for ongoing prenatal care.  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  Patient reports contractions and leaking of fluid. Reports trickle of watery fluid this morning that has not continued. Also reports painul ctx since this morning but can't tell how frequent they are. No vaginal bleeding. Positive fetal movement. .  Contractions: Irregular. Vag. Bleeding: None.  Movement: Present. Denies leaking of fluid.   The following portions of the patient's history were reviewed and updated as appropriate: allergies, current medications, past family history, past medical history, past social history, past surgical history and problem list. Problem list updated.  Objective:   Vitals:   03/04/17 0915  BP: 110/70  Pulse: (!) 109  Weight: 138 lb (62.6 kg)    Fetal Status: Fetal Heart Rate (bpm): 143 Fundal Height: 37 cm Movement: Present  Presentation: Vertex  General:  Alert, oriented and cooperative. Patient is in no acute distress.  Skin: Skin is warm and dry. No rash noted.   Cardiovascular: Normal heart rate noted  Respiratory: Normal respiratory effort, no problems with respiration noted  Abdomen: Soft, gravid, appropriate for gestational age.  Pain/Pressure: Present     Pelvic: Cervical exam performed Dilation: 1 Effacement (%): 50 Station: -3  SSE performed -- thin amount of physiologic discharge, no pooling of fluid  Extremities: Normal range of motion.  Edema: None  Mental Status:  Normal mood and affect. Normal behavior. Normal judgment and thought content.   Assessment and Plan:  Pregnancy: G1P0 at  1568w6d  1. Encounter for supervision of normal first pregnancy in third trimester   2. Encounter for suspected PROM, with rupture of membranes not found -No pooling and fern slide negative  Term labor symptoms and general obstetric precautions including but not limited to vaginal bleeding, contractions, leaking of fluid and fetal movement were reviewed in detail with the patient. Please refer to After Visit Summary for other counseling recommendations.  Return in about 1 week (around 03/11/2017) for Routine OB.   Judeth HornErin Jannely Henthorn, NP

## 2017-03-04 NOTE — Patient Instructions (Signed)
Braxton Hicks Contractions °Contractions of the uterus can occur throughout pregnancy, but they are not always a sign that you are in labor. You may have practice contractions called Braxton Hicks contractions. These false labor contractions are sometimes confused with true labor. °What are Braxton Hicks contractions? °Braxton Hicks contractions are tightening movements that occur in the muscles of the uterus before labor. Unlike true labor contractions, these contractions do not result in opening (dilation) and thinning of the cervix. Toward the end of pregnancy (32-34 weeks), Braxton Hicks contractions can happen more often and may become stronger. These contractions are sometimes difficult to tell apart from true labor because they can be very uncomfortable. You should not feel embarrassed if you go to the hospital with false labor. °Sometimes, the only way to tell if you are in true labor is for your health care provider to look for changes in the cervix. The health care provider will do a physical exam and may monitor your contractions. If you are not in true labor, the exam should show that your cervix is not dilating and your water has not broken. °If there are other health problems associated with your pregnancy, it is completely safe for you to be sent home with false labor. You may continue to have Braxton Hicks contractions until you go into true labor. °How to tell the difference between true labor and false labor °True labor °· Contractions last 30-70 seconds. °· Contractions become very regular. °· Discomfort is usually felt in the top of the uterus, and it spreads to the lower abdomen and low back. °· Contractions do not go away with walking. °· Contractions usually become more intense and increase in frequency. °· The cervix dilates and gets thinner. °False labor °· Contractions are usually shorter and not as strong as true labor contractions. °· Contractions are usually irregular. °· Contractions  are often felt in the front of the lower abdomen and in the groin. °· Contractions may go away when you walk around or change positions while lying down. °· Contractions get weaker and are shorter-lasting as time goes on. °· The cervix usually does not dilate or become thin. °Follow these instructions at home: °· Take over-the-counter and prescription medicines only as told by your health care provider. °· Keep up with your usual exercises and follow other instructions from your health care provider. °· Eat and drink lightly if you think you are going into labor. °· If Braxton Hicks contractions are making you uncomfortable: °? Change your position from lying down or resting to walking, or change from walking to resting. °? Sit and rest in a tub of warm water. °? Drink enough fluid to keep your urine pale yellow. Dehydration may cause these contractions. °? Do slow and deep breathing several times an hour. °· Keep all follow-up prenatal visits as told by your health care provider. This is important. °Contact a health care provider if: °· You have a fever. °· You have continuous pain in your abdomen. °Get help right away if: °· Your contractions become stronger, more regular, and closer together. °· You have fluid leaking or gushing from your vagina. °· You pass blood-tinged mucus (bloody show). °· You have bleeding from your vagina. °· You have low back pain that you never had before. °· You feel your baby’s head pushing down and causing pelvic pressure. °· Your baby is not moving inside you as much as it used to. °Summary °· Contractions that occur before labor are called Braxton   Hicks contractions, false labor, or practice contractions. °· Braxton Hicks contractions are usually shorter, weaker, farther apart, and less regular than true labor contractions. True labor contractions usually become progressively stronger and regular and they become more frequent. °· Manage discomfort from Braxton Hicks contractions by  changing position, resting in a warm bath, drinking plenty of water, or practicing deep breathing. °This information is not intended to replace advice given to you by your health care provider. Make sure you discuss any questions you have with your health care provider. °Document Released: 05/30/2016 Document Revised: 05/30/2016 Document Reviewed: 05/30/2016 °Elsevier Interactive Patient Education © 2018 [employer] Inc[employer] ° °

## 2017-03-05 ENCOUNTER — Ambulatory Visit (HOSPITAL_COMMUNITY)
Admission: AD | Admit: 2017-03-05 | Discharge: 2017-03-05 | Disposition: A | Discharge status: Home / Self Care | Payer: Medicaid Other | Source: Ambulatory Visit | Attending: Obstetrics and Gynecology | Admitting: Obstetrics and Gynecology

## 2017-03-05 DIAGNOSIS — Z3A00 Weeks of gestation of pregnancy not specified: Secondary | ICD-10-CM | POA: Diagnosis not present

## 2017-03-05 DIAGNOSIS — O479 False labor, unspecified: Secondary | ICD-10-CM | POA: Insufficient documentation

## 2017-03-05 DIAGNOSIS — O26843 Uterine size-date discrepancy, third trimester: Secondary | ICD-10-CM

## 2017-03-05 NOTE — OB Triage Note (Signed)
Pt states she has been having painful contractions since 1500.

## 2017-03-05 NOTE — OB Triage Note (Signed)
Provider reviewed reactive fetal tracing, orders given to discharge patient home with return instructions.  Instructions given and explained to patient with teach back, patient verbalizes understanding.

## 2017-03-05 NOTE — Discharge Instructions (Signed)
Abdominal Pain During Pregnancy °Belly (abdominal) pain is common during pregnancy. Most of the time, it is not a serious problem. Other times, it can be a sign that something is wrong with the pregnancy. Always tell your doctor if you have belly pain. °Follow these instructions at home: °Monitor your belly pain for any changes. The following actions may help you feel better: °· Do not have sex (intercourse) or put anything in your vagina until you feel better. °· Rest until your pain stops. °· Drink clear fluids if you feel sick to your stomach (nauseous). Do not eat solid food until you feel better. °· Only take medicine as told by your doctor. °· Keep all doctor visits as told. ° °Get help right away if: °· You are bleeding, leaking fluid, or pieces of tissue come out of your vagina. °· You have more pain or cramping. °· You keep throwing up (vomiting). °· You have pain when you pee (urinate) or have blood in your pee. °· You have a fever. °· You do not feel your baby moving as much. °· You feel very weak or feel like passing out. °· You have trouble breathing, with or without belly pain. °· You have a very bad headache and belly pain. °· You have fluid leaking from your vagina and belly pain. °· You keep having watery poop (diarrhea). °· Your belly pain does not go away after resting, or the pain gets worse. °This information is not intended to replace advice given to you by your health care provider. Make sure you discuss any questions you have with your health care provider. °Document Released: 01/02/2009 Document Revised: 08/23/2015 Document Reviewed: 08/13/2012 °Elsevier Interactive Patient Education © 2018 [employer] Inc[employer] ° ° °Braxton Hicks Contractions °Contractions of the uterus can occur throughout pregnancy, but they are not always a sign that you are in labor. You may have practice contractions called Braxton Hicks contractions. These false labor contractions are sometimes confused with true labor. °What  are Braxton Hicks contractions? °Braxton Hicks contractions are tightening movements that occur in the muscles of the uterus before labor. Unlike true labor contractions, these contractions do not result in opening (dilation) and thinning of the cervix. Toward the end of pregnancy (32-34 weeks), Braxton Hicks contractions can happen more often and may become stronger. These contractions are sometimes difficult to tell apart from true labor because they can be very uncomfortable. You should not feel embarrassed if you go to the hospital with false labor. °Sometimes, the only way to tell if you are in true labor is for your health care provider to look for changes in the cervix. The health care provider will do a physical exam and may monitor your contractions. If you are not in true labor, the exam should show that your cervix is not dilating and your water has not broken. °If there are other health problems associated with your pregnancy, it is completely safe for you to be sent home with false labor. You may continue to have Braxton Hicks contractions until you go into true labor. °How to tell the difference between true labor and false labor °True labor °· Contractions last 30-70 seconds. °· Contractions become very regular. °· Discomfort is usually felt in the top of the uterus, and it spreads to the lower abdomen and low back. °· Contractions do not go away with walking. °· Contractions usually become more intense and increase in frequency. °· The cervix dilates and gets thinner. °False labor °· Contractions are   usually shorter and not as strong as true labor contractions. °· Contractions are usually irregular. °· Contractions are often felt in the front of the lower abdomen and in the groin. °· Contractions may go away when you walk around or change positions while lying down. °· Contractions get weaker and are shorter-lasting as time goes on. °· The cervix usually does not dilate or become thin. °Follow these  instructions at home: °· Take over-the-counter and prescription medicines only as told by your health care provider. °· Keep up with your usual exercises and follow other instructions from your health care provider. °· Eat and drink lightly if you think you are going into labor. °· If Braxton Hicks contractions are making you uncomfortable: °? Change your position from lying down or resting to walking, or change from walking to resting. °? Sit and rest in a tub of warm water. °? Drink enough fluid to keep your urine pale yellow. Dehydration may cause these contractions. °? Do slow and deep breathing several times an hour. °· Keep all follow-up prenatal visits as told by your health care provider. This is important. °Contact a health care provider if: °· You have a fever. °· You have continuous pain in your abdomen. °Get help right away if: °· Your contractions become stronger, more regular, and closer together. °· You have fluid leaking or gushing from your vagina. °· You pass blood-tinged mucus (bloody show). °· You have bleeding from your vagina. °· You have low back pain that you never had before. °· You feel your baby’s head pushing down and causing pelvic pressure. °· Your baby is not moving inside you as much as it used to. °Summary °· Contractions that occur before labor are called Braxton Hicks contractions, false labor, or practice contractions. °· Braxton Hicks contractions are usually shorter, weaker, farther apart, and less regular than true labor contractions. True labor contractions usually become progressively stronger and regular and they become more frequent. °· Manage discomfort from Braxton Hicks contractions by changing position, resting in a warm bath, drinking plenty of water, or practicing deep breathing. °This information is not intended to replace advice given to you by your health care provider. Make sure you discuss any questions you have with your health care provider. °Document Released:  05/30/2016 Document Revised: 05/30/2016 Document Reviewed: 05/30/2016 °Elsevier Interactive Patient Education © 2018 Elsevier Inc. ° °

## 2017-03-07 ENCOUNTER — Inpatient Hospital Stay (EMERGENCY_DEPARTMENT_HOSPITAL)
Admission: AD | Admit: 2017-03-07 | Discharge: 2017-03-07 | Disposition: A | Discharge status: Home / Self Care | Payer: Medicaid Other | Source: Ambulatory Visit | Attending: Obstetrics & Gynecology | Admitting: Obstetrics & Gynecology

## 2017-03-07 ENCOUNTER — Telehealth: Payer: Self-pay | Admitting: *Deleted

## 2017-03-07 ENCOUNTER — Inpatient Hospital Stay (HOSPITAL_COMMUNITY)
Admission: AD | Admit: 2017-03-07 | Discharge: 2017-03-07 | Disposition: A | Discharge status: Home / Self Care | Payer: Medicaid Other | Source: Ambulatory Visit | Attending: Obstetrics and Gynecology | Admitting: Obstetrics and Gynecology

## 2017-03-07 ENCOUNTER — Encounter (HOSPITAL_COMMUNITY): Payer: Self-pay

## 2017-03-07 ENCOUNTER — Encounter (HOSPITAL_COMMUNITY): Payer: Self-pay | Admitting: *Deleted

## 2017-03-07 DIAGNOSIS — Z3A39 39 weeks gestation of pregnancy: Secondary | ICD-10-CM

## 2017-03-07 DIAGNOSIS — O99513 Diseases of the respiratory system complicating pregnancy, third trimester: Secondary | ICD-10-CM | POA: Diagnosis not present

## 2017-03-07 DIAGNOSIS — O471 False labor at or after 37 completed weeks of gestation: Secondary | ICD-10-CM | POA: Insufficient documentation

## 2017-03-07 DIAGNOSIS — Z8042 Family history of malignant neoplasm of prostate: Secondary | ICD-10-CM | POA: Insufficient documentation

## 2017-03-07 DIAGNOSIS — Z0371 Encounter for suspected problem with amniotic cavity and membrane ruled out: Secondary | ICD-10-CM

## 2017-03-07 DIAGNOSIS — Z801 Family history of malignant neoplasm of trachea, bronchus and lung: Secondary | ICD-10-CM | POA: Diagnosis not present

## 2017-03-07 DIAGNOSIS — O26843 Uterine size-date discrepancy, third trimester: Secondary | ICD-10-CM | POA: Diagnosis not present

## 2017-03-07 DIAGNOSIS — Z87891 Personal history of nicotine dependence: Secondary | ICD-10-CM | POA: Diagnosis not present

## 2017-03-07 DIAGNOSIS — J069 Acute upper respiratory infection, unspecified: Secondary | ICD-10-CM | POA: Insufficient documentation

## 2017-03-07 DIAGNOSIS — B9789 Other viral agents as the cause of diseases classified elsewhere: Secondary | ICD-10-CM

## 2017-03-07 DIAGNOSIS — O479 False labor, unspecified: Secondary | ICD-10-CM | POA: Diagnosis not present

## 2017-03-07 LAB — URINALYSIS, ROUTINE W REFLEX MICROSCOPIC
BILIRUBIN URINE: NEGATIVE
GLUCOSE, UA: NEGATIVE mg/dL
HGB URINE DIPSTICK: NEGATIVE
KETONES UR: NEGATIVE mg/dL
Nitrite: NEGATIVE
Protein, ur: 30 mg/dL — AB
Specific Gravity, Urine: 1.027 (ref 1.005–1.030)
pH: 6 (ref 5.0–8.0)

## 2017-03-07 LAB — POCT FERN TEST: POCT Fern Test: NEGATIVE

## 2017-03-07 MED ORDER — PSEUDOEPHEDRINE HCL 30 MG PO TABS
60.0000 mg | ORAL_TABLET | Freq: Once | ORAL | Status: AC
  Administered 2017-03-07: 60 mg via ORAL
  Filled 2017-03-07: qty 2

## 2017-03-07 MED ORDER — ACETAMINOPHEN 500 MG PO TABS
1000.0000 mg | ORAL_TABLET | Freq: Once | ORAL | Status: AC
  Administered 2017-03-07: 1000 mg via ORAL
  Filled 2017-03-07: qty 2

## 2017-03-07 NOTE — MAU Provider Note (Signed)
S: Meghan Frederick is a 32 y.o. G1P0 at 6553w2d  who presents to MAU today complaining of leaking of fluid since 2000. She denies vaginal bleeding. She endorses contractions. She reports normal fetal movement.    O: BP 122/79 (BP Location: Right Arm)   Pulse 89   Temp 98.1 F (36.7 C) (Oral)   Resp 16   Ht 5\' 4"  (1.626 m)   Wt 135 lb (61.2 kg)   LMP 06/05/2016   SpO2 100%   BMI 23.17 kg/m  GENERAL: Well-developed, well-nourished female in no acute distress.  HEAD: Normocephalic, atraumatic.  CHEST: Normal effort of breathing, regular heart rate ABDOMEN: Soft, nontender, gravid PELVIC: Normal external female genitalia. Vagina is pink and rugated. Cervix with normal contour, no lesions. Normal discharge.  No pooling.   Cervical exam:  Dilation: 1.5 Cervical Position: Posterior Exam by:: Cleone Slimaroline Lindel Marcell CNM   Fetal Monitoring: Baseline: 120 Variability: moderate Accelerations: 15x15 Decelerations: none Contractions: irregular  Results for orders placed or performed during the hospital encounter of 03/07/17 (from the past 24 hour(s))  Urinalysis, Routine w reflex microscopic     Status: Abnormal   Collection Time: 03/07/17  1:50 PM  Result Value Ref Range   Color, Urine AMBER (A) YELLOW   APPearance CLOUDY (A) CLEAR   Specific Gravity, Urine 1.027 1.005 - 1.030   pH 6.0 5.0 - 8.0   Glucose, UA NEGATIVE NEGATIVE mg/dL   Hgb urine dipstick NEGATIVE NEGATIVE   Bilirubin Urine NEGATIVE NEGATIVE   Ketones, ur NEGATIVE NEGATIVE mg/dL   Protein, ur 30 (A) NEGATIVE mg/dL   Nitrite NEGATIVE NEGATIVE   Leukocytes, UA TRACE (A) NEGATIVE   RBC / HPF 0-5 0 - 5 RBC/hpf   WBC, UA 0-5 0 - 5 WBC/hpf   Bacteria, UA RARE (A) NONE SEEN   Squamous Epithelial / LPF TOO NUMEROUS TO COUNT (A) NONE SEEN   Mucus PRESENT      A: SIUP at 1953w2d  Membranes intact  P: Discharge patient home -Term labor precautions discussed -[facility] -Patient may return to MAU as needed or if her condition were to change or worsen   Rolm Bookbindereill, Maelyn Berrey M, [place] 03/07/2017 9:05 PM

## 2017-03-07 NOTE — MAU Provider Note (Signed)
History     CSN: [id number]  Arrival date and time: 03/07/17 1345   First Provider Initiated Contact with Patient 03/07/17 1449      Chief Complaint  Patient presents with  . URI   Meghan Frederick is a 32 y.o.  G1P0 at [redacted]w[redacted]d who presents today with URI sx since 03/05/17. She states that she has had cough, sneezing and ear pain since then. She denies any fever. She took benadryl and tylenol x 1 on 2/6, but has not taken anything since. She denies any VB or LOF. She reports normal fetal movement. Next appt 03/10/17.    URI   This is a new problem. The current episode started in the past 7 days. The problem has been unchanged. There has been no fever. Associated symptoms include coughing, ear pain and sinus pain. She has tried acetaminophen for the symptoms. The treatment provided no relief.    Past Medical History:  Diagnosis Date  . Medical history non-contributory     Past Surgical History:  Procedure Laterality Date  . I&D EXTREMITY Right    Middle finger    Family History  Problem Relation Age of Onset  . Cancer Mother        lung  . Cancer Father        prostate    Social History   Tobacco Use  . Smoking status: Former Smoker    Last attempt to quit: 01/28/2009    Years since quitting: 8.1  . Smokeless tobacco: Never Used  . Tobacco comment: 2011  Substance Use Topics  . Alcohol use: Yes    Comment: social, stopped prior to learning of preg  . Drug use: No    Allergies:  Allergies  Allergen Reactions  . Dust Mite Extract Itching and Other (See Comments)    Runny nose, sore throat  . Pollen Extract Itching    Medications Prior to Admission  Medication Sig Dispense Refill Last Dose  . acetaminophen (TYLENOL) 325 MG tablet Take 325 mg by mouth every 6 (six) hours as needed for moderate pain.   Past Week at Unknown time  . diphenhydrAMINE (BENADRYL) 12.5 MG/5ML elixir Take 12.5 mg by mouth daily as needed for allergies.   Past Week at Unknown time  . Prenatal  Vit-Fe Fumarate-FA (PRENATAL VITAMIN) 27-0.8 MG TABS Take 1 tablet by mouth daily. 30 tablet 5 Past Week at Unknown time    Review of Systems  Constitutional: Negative for chills and fever.  HENT: Positive for ear pain and sinus pain.   Respiratory: Positive for cough.   Genitourinary: Negative for pelvic pain, vaginal bleeding and vaginal discharge.   Physical Exam   Blood pressure 111/75, pulse 92, temperature 98.2 F (36.8 C), temperature source Oral, resp. rate 16, height 5\' 4"  (1.626 m), weight 135 lb (61.2 kg), last menstrual period 06/05/2016.  Physical Exam  Nursing note and vitals reviewed. Constitutional: She is oriented to person, place, and time. She appears well-developed and well-nourished. No distress.  HENT:  Head: Normocephalic.  Right Ear: No swelling or tenderness. Tympanic membrane is not erythematous and not bulging. A middle ear effusion is present.  Left Ear: No swelling or tenderness. Tympanic membrane is not erythematous and not bulging. A middle ear effusion is present.  Cardiovascular: Normal rate.  Respiratory: Effort normal.  GI: Soft. There is no tenderness. There is no rebound.  Neurological: She is alert and oriented to person, place, and time.  Skin: Skin is warm and dry.  Psychiatric: She has a normal mood and affect.    FHT: 140, moderate with 15x15 accels, no decels Toco: irreg ctx about every 2-7 mins MAU Course  Procedures  MDM   Assessment and Plan   1. Viral URI with cough   2. Uterine size date discrepancy pregnancy, third trimester   3. [redacted] weeks gestation of pregnancy   4. Braxton Hicks contractions    DC home Comfort measures reviewed  3rd Trimester precautions  labor precautions  Fetal kick counts RX: safe medications in pregnancy list given  Return to MAU as needed FU with OB as planned  Follow-up Information    [facility].   Specialty:  Obstetrics and Gynecology Contact  information: [address] [place] [id number] [phone]           Thressa ShellerHeather Eshaal Duby 03/07/2017, 2:51 PM

## 2017-03-07 NOTE — MAU Note (Signed)
Pt started with nasal congestion last Wednesday. Progressed to cough and itchy, full ears. Pt says cough worsened today with chills. Denies any N/V/D, sore throat or ear pain. Feels baby move at least hourly.

## 2017-03-07 NOTE — Telephone Encounter (Signed)
Received message left on nurse voicemail on 03/07/17.  Patient states she was seen on Wednesday for contractions about 6 minutes apart.  States since then she has developed a cough and doesn't know if this is something she needs to come in to have evaluated.  States last night she had contractions for about 90 minutes that were 6-7 minutes apart with low back pain.  States now she is having menstrual like cramping.  Requests a return call to [phone].

## 2017-03-07 NOTE — MAU Note (Signed)
Pt states gush of clear fluid around 2000, which has continued to trickle out. Pt reports contractions every 3-4 mins. Denies vaginal bleeding. Reports good fetal movement. Cervix was 1.5cm on last exam.

## 2017-03-07 NOTE — Discharge Instructions (Signed)
Braxton Hicks Contractions °Contractions of the uterus can occur throughout pregnancy, but they are not always a sign that you are in labor. You may have practice contractions called Braxton Hicks contractions. These false labor contractions are sometimes confused with true labor. °What are Braxton Hicks contractions? °Braxton Hicks contractions are tightening movements that occur in the muscles of the uterus before labor. Unlike true labor contractions, these contractions do not result in opening (dilation) and thinning of the cervix. Toward the end of pregnancy (32-34 weeks), Braxton Hicks contractions can happen more often and may become stronger. These contractions are sometimes difficult to tell apart from true labor because they can be very uncomfortable. You should not feel embarrassed if you go to the hospital with false labor. °Sometimes, the only way to tell if you are in true labor is for your health care provider to look for changes in the cervix. The health care provider will do a physical exam and may monitor your contractions. If you are not in true labor, the exam should show that your cervix is not dilating and your water has not broken. °If there are other health problems associated with your pregnancy, it is completely safe for you to be sent home with false labor. You may continue to have Braxton Hicks contractions until you go into true labor. °How to tell the difference between true labor and false labor °True labor °· Contractions last 30-70 seconds. °· Contractions become very regular. °· Discomfort is usually felt in the top of the uterus, and it spreads to the lower abdomen and low back. °· Contractions do not go away with walking. °· Contractions usually become more intense and increase in frequency. °· The cervix dilates and gets thinner. °False labor °· Contractions are usually shorter and not as strong as true labor contractions. °· Contractions are usually irregular. °· Contractions  are often felt in the front of the lower abdomen and in the groin. °· Contractions may go away when you walk around or change positions while lying down. °· Contractions get weaker and are shorter-lasting as time goes on. °· The cervix usually does not dilate or become thin. °Follow these instructions at home: °· Take over-the-counter and prescription medicines only as told by your health care provider. °· Keep up with your usual exercises and follow other instructions from your health care provider. °· Eat and drink lightly if you think you are going into labor. °· If Braxton Hicks contractions are making you uncomfortable: °? Change your position from lying down or resting to walking, or change from walking to resting. °? Sit and rest in a tub of warm water. °? Drink enough fluid to keep your urine pale yellow. Dehydration may cause these contractions. °? Do slow and deep breathing several times an hour. °· Keep all follow-up prenatal visits as told by your health care provider. This is important. °Contact a health care provider if: °· You have a fever. °· You have continuous pain in your abdomen. °Get help right away if: °· Your contractions become stronger, more regular, and closer together. °· You have fluid leaking or gushing from your vagina. °· You pass blood-tinged mucus (bloody show). °· You have bleeding from your vagina. °· You have low back pain that you never had before. °· You feel your baby’s head pushing down and causing pelvic pressure. °· Your baby is not moving inside you as much as it used to. °Summary °· Contractions that occur before labor are called Braxton   Hicks contractions, false labor, or practice contractions. °· Braxton Hicks contractions are usually shorter, weaker, farther apart, and less regular than true labor contractions. True labor contractions usually become progressively stronger and regular and they become more frequent. °· Manage discomfort from Braxton Hicks contractions by  changing position, resting in a warm bath, drinking plenty of water, or practicing deep breathing. °This information is not intended to replace advice given to you by your health care provider. Make sure you discuss any questions you have with your health care provider. °Document Released: 05/30/2016 Document Revised: 05/30/2016 Document Reviewed: 05/30/2016 °Elsevier Interactive Patient Education © 2018 [employer] ° °Fetal Movement Counts °Patient Name: ________________________________________________ Patient Due Date: ____________________ °What is a fetal movement count? °A fetal movement count is the number of times that you feel your baby move during a certain amount of time. This may also be called a fetal kick count. A fetal movement count is recommended for every pregnant woman. You may be asked to start counting fetal movements as early as week 28 of your pregnancy. °Pay attention to when your baby is most active. You may notice your baby's sleep and wake cycles. You may also notice things that make your baby move more. You should do a fetal movement count: °· When your baby is normally most active. °· At the same time each day. ° °A good time to count movements is while you are resting, after having something to eat and drink. °How do I count fetal movements? °1. Find a quiet, comfortable area. Sit, or lie down on your side. °2. Write down the date, the start time and stop time, and the number of movements that you felt between those two times. Take this information with you to your health care visits. °3. For 2 hours, count kicks, flutters, swishes, rolls, and jabs. You should feel at least 10 movements during 2 hours. °4. You may stop counting after you have felt 10 movements. °5. If you do not feel 10 movements in 2 hours, have something to eat and drink. Then, keep resting and counting for 1 hour. If you feel at least 4 movements during that hour, you may stop counting. °Contact a health care  provider if: °· You feel fewer than 4 movements in 2 hours. °· Your baby is not moving like he or she usually does. °Date: ____________ Start time: ____________ Stop time: ____________ Movements: ____________ °Date: ____________ Start time: ____________ Stop time: ____________ Movements: ____________ °Date: ____________ Start time: ____________ Stop time: ____________ Movements: ____________ °Date: ____________ Start time: ____________ Stop time: ____________ Movements: ____________ °Date: ____________ Start time: ____________ Stop time: ____________ Movements: ____________ °Date: ____________ Start time: ____________ Stop time: ____________ Movements: ____________ °Date: ____________ Start time: ____________ Stop time: ____________ Movements: ____________ °Date: ____________ Start time: ____________ Stop time: ____________ Movements: ____________ °Date: ____________ Start time: ____________ Stop time: ____________ Movements: ____________ °This information is not intended to replace advice given to you by your health care provider. Make sure you discuss any questions you have with your health care provider. °Document Released: 02/13/2006 Document Revised: 09/13/2015 Document Reviewed: 02/23/2015 °Elsevier Interactive Patient Education © 2018 [employer] ° °

## 2017-03-07 NOTE — Discharge Instructions (Signed)

## 2017-03-11 ENCOUNTER — Encounter (HOSPITAL_COMMUNITY): Payer: Self-pay | Admitting: *Deleted

## 2017-03-11 ENCOUNTER — Ambulatory Visit (INDEPENDENT_AMBULATORY_CARE_PROVIDER_SITE_OTHER): Payer: Medicaid Other

## 2017-03-11 ENCOUNTER — Telehealth (HOSPITAL_COMMUNITY): Payer: Self-pay | Admitting: *Deleted

## 2017-03-11 ENCOUNTER — Encounter: Payer: Self-pay | Admitting: *Deleted

## 2017-03-11 VITALS — BP 128/79 | HR 87 | Wt 136.3 lb

## 2017-03-11 DIAGNOSIS — Z3403 Encounter for supervision of normal first pregnancy, third trimester: Secondary | ICD-10-CM

## 2017-03-11 NOTE — Patient Instructions (Signed)
Labor Induction Labor induction is when steps are taken to cause a pregnant woman to begin the labor process. Most women go into labor on their own between 37 weeks and 42 weeks of the pregnancy. When this does not happen or when there is a medical need, methods may be used to induce labor. Labor induction causes a pregnant woman's uterus to contract. It also causes the cervix to soften (ripen), open (dilate), and thin out (efface). Usually, labor is not induced before 39 weeks of the pregnancy unless there is a problem with the baby or mother. Before inducing labor, your health care provider will consider a number of factors, including the following:  The medical condition of you and the baby.  How many weeks along you are.  The status of the baby's lung maturity.  The condition of the cervix.  The position of the baby. What are the reasons for labor induction? Labor may be induced for the following reasons:  The health of the baby or mother is at risk.  The pregnancy is overdue by 1 week or more.  The water breaks but labor does not start on its own.  The mother has a health condition or serious illness, such as high blood pressure, infection, placental abruption, or diabetes.  The amniotic fluid amounts are low around the baby.  The baby is distressed. Convenience or wanting the baby to be born on a certain date is not a reason for inducing labor. What methods are used for labor induction? Several methods of labor induction may be used, such as:  Prostaglandin medicine. This medicine causes the cervix to dilate and ripen. The medicine will also start contractions. It can be taken by mouth or by inserting a suppository into the vagina.  Inserting a thin tube (catheter) with a balloon on the end into the vagina to dilate the cervix. Once inserted, the balloon is expanded with water, which causes the cervix to open.  Stripping the membranes. Your health care provider separates  amniotic sac tissue from the cervix, causing the cervix to be stretched and causing the release of a hormone called progesterone. This may cause the uterus to contract. It is often done during an office visit. You will be sent home to wait for the contractions to begin. You will then come in for an induction.  Breaking the water. Your health care provider makes a hole in the amniotic sac using a small instrument. Once the amniotic sac breaks, contractions should begin. This may still take hours to see an effect.  Medicine to trigger or strengthen contractions. This medicine is given through an IV access tube inserted into a vein in your arm. All of the methods of induction, besides stripping the membranes, will be done in the hospital. Induction is done in the hospital so that you and the baby can be carefully monitored. How long does it take for labor to be induced? Some inductions can take up to 2-3 days. Depending on the cervix, it usually takes less time. It takes longer when you are induced early in the pregnancy or if this is your first pregnancy. If a mother is still pregnant and the induction has been going on for 2-3 days, either the mother will be sent home or a cesarean delivery will be needed. What are the risks associated with labor induction? Some of the risks of induction include:  Changes in fetal heart rate, such as too high, too low, or erratic.  Fetal distress.    Chance of infection for the mother and baby.  Increased chance of having a cesarean delivery.  Breaking off (abruption) of the placenta from the uterus (rare).  Uterine rupture (very rare). When induction is needed for medical reasons, the benefits of induction may outweigh the risks. What are some reasons for not inducing labor? Labor induction should not be done if:  It is shown that your baby does not tolerate labor.  You have had previous surgeries on your uterus, such as a myomectomy or the removal of  fibroids.  Your placenta lies very low in the uterus and blocks the opening of the cervix (placenta previa).  Your baby is not in a head-down position.  The umbilical cord drops down into the birth canal in front of the baby. This could cut off the baby's blood and oxygen supply.  You have had a previous cesarean delivery.  There are unusual circumstances, such as the baby being extremely premature. This information is not intended to replace advice given to you by your health care provider. Make sure you discuss any questions you have with your health care provider. Document Released: 06/05/2006 Document Revised: 06/22/2015 Document Reviewed: 08/13/2012 Elsevier Interactive Patient Education  2017 [employer] Inc.  

## 2017-03-11 NOTE — Telephone Encounter (Signed)
Preadmission screen  

## 2017-03-11 NOTE — Progress Notes (Signed)
   PRENATAL VISIT NOTE  Subjective:  Meghan Frederick is a 32 y.o. G1P0 at 8029w6d being seen today for ongoing prenatal care.  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  Patient reports no complaints.  Contractions: Irregular. Vag. Bleeding: None.  Movement: Present. Denies leaking of fluid.   The following portions of the patient's history were reviewed and updated as appropriate: allergies, current medications, past family history, past medical history, past social history, past surgical history and problem list. Problem list updated.  Objective:   Vitals:   03/11/17 0909  BP: 128/79  Pulse: 87  Weight: 133 lb 9.6 oz (60.6 kg)    Fetal Status: Fetal Heart Rate (bpm): 150 Fundal Height: 39 cm Movement: Present     General:  Alert, oriented and cooperative. Patient is in no acute distress.  Skin: Skin is warm and dry. No rash noted.   Cardiovascular: Normal heart rate noted  Respiratory: Normal respiratory effort, no problems with respiration noted  Abdomen: Soft, gravid, appropriate for gestational age.  Pain/Pressure: Present     Pelvic: Cervical exam performed Dilation: 1 Effacement (%): 50 Station: Ballotable  Extremities: Normal range of motion.  Edema: None  Mental Status:  Normal mood and affect. Normal behavior. Normal judgment and thought content.   Assessment and Plan:  Pregnancy: G1P0 at 7229w6d  1. Encounter for supervision of normal first pregnancy in third trimester -No complaints. Routine care -IOL scheduled for 2/20, reviewed methods of induction and plan  -GBS positive- will need prophylaxis during labor  Term labor symptoms and general obstetric precautions including but not  limited to vaginal bleeding, contractions, leaking of fluid and fetal movement were reviewed in detail with the patient. Please refer to After Visit Summary for other counseling recommendations.  Return in about 1 week (around 03/18/2017) for Return OB visit.  Rolm BookbinderCaroline M Willie Loy, CNM  03/11/17 9:21 AM

## 2017-03-14 ENCOUNTER — Other Ambulatory Visit: Payer: Self-pay | Admitting: Advanced Practice Midwife

## 2017-03-15 ENCOUNTER — Inpatient Hospital Stay (HOSPITAL_COMMUNITY): Payer: Medicaid Other | Admitting: Anesthesiology

## 2017-03-15 ENCOUNTER — Other Ambulatory Visit: Payer: Self-pay

## 2017-03-15 ENCOUNTER — Inpatient Hospital Stay (HOSPITAL_COMMUNITY)
Admission: AD | Admit: 2017-03-15 | Discharge: 2017-03-17 | DRG: 807 | Disposition: A | Discharge status: Home / Self Care | Payer: Medicaid Other | Source: Ambulatory Visit | Attending: Obstetrics and Gynecology | Admitting: Obstetrics and Gynecology

## 2017-03-15 ENCOUNTER — Encounter (HOSPITAL_COMMUNITY): Payer: Self-pay | Admitting: *Deleted

## 2017-03-15 DIAGNOSIS — D649 Anemia, unspecified: Secondary | ICD-10-CM | POA: Diagnosis present

## 2017-03-15 DIAGNOSIS — Z3A40 40 weeks gestation of pregnancy: Secondary | ICD-10-CM

## 2017-03-15 DIAGNOSIS — Z3A41 41 weeks gestation of pregnancy: Secondary | ICD-10-CM

## 2017-03-15 DIAGNOSIS — O9902 Anemia complicating childbirth: Secondary | ICD-10-CM | POA: Diagnosis present

## 2017-03-15 DIAGNOSIS — O99824 Streptococcus B carrier state complicating childbirth: Secondary | ICD-10-CM | POA: Diagnosis present

## 2017-03-15 DIAGNOSIS — O4292 Full-term premature rupture of membranes, unspecified as to length of time between rupture and onset of labor: Secondary | ICD-10-CM | POA: Diagnosis present

## 2017-03-15 DIAGNOSIS — O480 Post-term pregnancy: Principal | ICD-10-CM | POA: Diagnosis present

## 2017-03-15 DIAGNOSIS — Z87891 Personal history of nicotine dependence: Secondary | ICD-10-CM | POA: Diagnosis not present

## 2017-03-15 DIAGNOSIS — O26843 Uterine size-date discrepancy, third trimester: Secondary | ICD-10-CM

## 2017-03-15 LAB — CBC
HCT: 35.3 % — ABNORMAL LOW (ref 36.0–46.0)
HEMOGLOBIN: 11.8 g/dL — AB (ref 12.0–15.0)
MCH: 28.1 pg (ref 26.0–34.0)
MCHC: 33.4 g/dL (ref 30.0–36.0)
MCV: 84 fL (ref 78.0–100.0)
Platelets: 245 10*3/uL (ref 150–400)
RBC: 4.2 MIL/uL (ref 3.87–5.11)
RDW: 14.1 % (ref 11.5–15.5)
WBC: 11.7 10*3/uL — AB (ref 4.0–10.5)

## 2017-03-15 LAB — TYPE AND SCREEN
ABO/RH(D): O POS
ANTIBODY SCREEN: NEGATIVE

## 2017-03-15 LAB — ABO/RH: ABO/RH(D): O POS

## 2017-03-15 LAB — POCT FERN TEST: POCT Fern Test: POSITIVE

## 2017-03-15 LAB — RPR: RPR: NONREACTIVE

## 2017-03-15 MED ORDER — SODIUM CHLORIDE 0.9 % IV SOLN
5.0000 10*6.[IU] | Freq: Once | INTRAVENOUS | Status: AC
  Administered 2017-03-15: 5 10*6.[IU] via INTRAVENOUS
  Filled 2017-03-15: qty 5

## 2017-03-15 MED ORDER — TERBUTALINE SULFATE 1 MG/ML IJ SOLN
0.2500 mg | Freq: Once | INTRAMUSCULAR | Status: DC | PRN
  Filled 2017-03-15: qty 1

## 2017-03-15 MED ORDER — PRENATAL MULTIVITAMIN CH
1.0000 | ORAL_TABLET | Freq: Every day | ORAL | Status: DC
  Administered 2017-03-16: 1 via ORAL
  Filled 2017-03-15: qty 1

## 2017-03-15 MED ORDER — ONDANSETRON HCL 4 MG/2ML IJ SOLN: 4.0000 mg | Freq: Four times a day (QID) | INTRAMUSCULAR | Status: DC | PRN

## 2017-03-15 MED ORDER — SENNOSIDES-DOCUSATE SODIUM 8.6-50 MG PO TABS
2.0000 | ORAL_TABLET | ORAL | Status: DC
  Administered 2017-03-15 – 2017-03-16 (×2): 2 via ORAL
  Filled 2017-03-15 (×2): qty 2

## 2017-03-15 MED ORDER — LACTATED RINGERS IV SOLN
INTRAVENOUS | Status: DC
  Administered 2017-03-15 (×2): via INTRAVENOUS

## 2017-03-15 MED ORDER — SOD CITRATE-CITRIC ACID 500-334 MG/5ML PO SOLN: 30.0000 mL | ORAL | Status: DC | PRN

## 2017-03-15 MED ORDER — SIMETHICONE 80 MG PO CHEW: 80.0000 mg | CHEWABLE_TABLET | ORAL | Status: DC | PRN

## 2017-03-15 MED ORDER — LACTATED RINGERS IV SOLN
500.0000 mL | Freq: Once | INTRAVENOUS | Status: AC
  Administered 2017-03-15: 500 mL via INTRAVENOUS

## 2017-03-15 MED ORDER — WITCH HAZEL-GLYCERIN EX PADS: 1.0000 "application " | MEDICATED_PAD | CUTANEOUS | Status: DC | PRN

## 2017-03-15 MED ORDER — OXYTOCIN 40 UNITS IN LACTATED RINGERS INFUSION - SIMPLE MED
2.5000 [IU]/h | INTRAVENOUS | Status: DC
  Administered 2017-03-15: 2.5 [IU]/h via INTRAVENOUS
  Filled 2017-03-15: qty 1000

## 2017-03-15 MED ORDER — OXYCODONE-ACETAMINOPHEN 5-325 MG PO TABS: 1.0000 | ORAL_TABLET | ORAL | Status: DC | PRN

## 2017-03-15 MED ORDER — LACTATED RINGERS IV SOLN: 500.0000 mL | INTRAVENOUS | Status: DC | PRN

## 2017-03-15 MED ORDER — DIPHENHYDRAMINE HCL 25 MG PO CAPS: 25.0000 mg | ORAL_CAPSULE | Freq: Four times a day (QID) | ORAL | Status: DC | PRN

## 2017-03-15 MED ORDER — ONDANSETRON HCL 4 MG PO TABS
4.0000 mg | ORAL_TABLET | ORAL | Status: DC | PRN
Start: 2017-03-15 — End: 2017-03-17

## 2017-03-15 MED ORDER — PHENYLEPHRINE 40 MCG/ML (10ML) SYRINGE FOR IV PUSH (FOR BLOOD PRESSURE SUPPORT)
80.0000 ug | PREFILLED_SYRINGE | INTRAVENOUS | Status: DC | PRN
  Filled 2017-03-15: qty 5

## 2017-03-15 MED ORDER — EPHEDRINE 5 MG/ML INJ
10.0000 mg | INTRAVENOUS | Status: DC | PRN
  Filled 2017-03-15: qty 2

## 2017-03-15 MED ORDER — LACTATED RINGERS IV SOLN: 500.0000 mL | Freq: Once | INTRAVENOUS | Status: DC

## 2017-03-15 MED ORDER — TETANUS-DIPHTH-ACELL PERTUSSIS 5-2.5-18.5 LF-MCG/0.5 IM SUSP: 0.5000 mL | Freq: Once | INTRAMUSCULAR | Status: DC

## 2017-03-15 MED ORDER — DIPHENHYDRAMINE HCL 50 MG/ML IJ SOLN: 12.5000 mg | INTRAMUSCULAR | Status: DC | PRN

## 2017-03-15 MED ORDER — IBUPROFEN 600 MG PO TABS
600.0000 mg | ORAL_TABLET | Freq: Four times a day (QID) | ORAL | Status: DC
  Administered 2017-03-15 (×2): 600 mg via ORAL
  Filled 2017-03-15 (×2): qty 1

## 2017-03-15 MED ORDER — LIDOCAINE HCL (PF) 1 % IJ SOLN
30.0000 mL | INTRAMUSCULAR | Status: DC | PRN
  Filled 2017-03-15: qty 30

## 2017-03-15 MED ORDER — BENZOCAINE-MENTHOL 20-0.5 % EX AERO
1.0000 "application " | INHALATION_SPRAY | CUTANEOUS | Status: DC | PRN
  Administered 2017-03-15: 1 via TOPICAL
  Filled 2017-03-15: qty 56

## 2017-03-15 MED ORDER — ONDANSETRON HCL 4 MG/2ML IJ SOLN: 4.0000 mg | INTRAMUSCULAR | Status: DC | PRN

## 2017-03-15 MED ORDER — OXYTOCIN BOLUS FROM INFUSION
500.0000 mL | Freq: Once | INTRAVENOUS | Status: AC
  Administered 2017-03-15: 500 mL via INTRAVENOUS

## 2017-03-15 MED ORDER — LIDOCAINE HCL (PF) 1 % IJ SOLN
INTRAMUSCULAR | Status: DC | PRN
  Administered 2017-03-15 (×2): 4 mL via EPIDURAL

## 2017-03-15 MED ORDER — COCONUT OIL OIL
1.0000 "application " | TOPICAL_OIL | Status: DC | PRN
  Administered 2017-03-17: 1 via TOPICAL
  Filled 2017-03-15: qty 120

## 2017-03-15 MED ORDER — ACETAMINOPHEN 325 MG PO TABS
650.0000 mg | ORAL_TABLET | ORAL | Status: DC | PRN
  Administered 2017-03-15: 650 mg via ORAL
  Filled 2017-03-15: qty 2

## 2017-03-15 MED ORDER — FENTANYL CITRATE (PF) 100 MCG/2ML IJ SOLN
100.0000 ug | INTRAMUSCULAR | Status: DC | PRN
  Administered 2017-03-15: 100 ug via INTRAVENOUS
  Filled 2017-03-15 (×2): qty 2

## 2017-03-15 MED ORDER — FENTANYL 2.5 MCG/ML BUPIVACAINE 1/10 % EPIDURAL INFUSION (WH - ANES)
14.0000 mL/h | INTRAMUSCULAR | Status: DC | PRN
  Administered 2017-03-15: 14 mL/h via EPIDURAL
  Filled 2017-03-15: qty 100

## 2017-03-15 MED ORDER — DIBUCAINE 1 % RE OINT: 1.0000 "application " | TOPICAL_OINTMENT | RECTAL | Status: DC | PRN

## 2017-03-15 MED ORDER — PHENYLEPHRINE 40 MCG/ML (10ML) SYRINGE FOR IV PUSH (FOR BLOOD PRESSURE SUPPORT)
80.0000 ug | PREFILLED_SYRINGE | INTRAVENOUS | Status: DC | PRN
  Filled 2017-03-15: qty 10
  Filled 2017-03-15: qty 5

## 2017-03-15 MED ORDER — ACETAMINOPHEN 325 MG PO TABS: 650.0000 mg | ORAL_TABLET | ORAL | Status: DC | PRN

## 2017-03-15 MED ORDER — PENICILLIN G POT IN DEXTROSE 60000 UNIT/ML IV SOLN
3.0000 10*6.[IU] | INTRAVENOUS | Status: DC
  Administered 2017-03-15: 3 10*6.[IU] via INTRAVENOUS
  Filled 2017-03-15 (×8): qty 50

## 2017-03-15 MED ORDER — OXYCODONE-ACETAMINOPHEN 5-325 MG PO TABS: 2.0000 | ORAL_TABLET | ORAL | Status: DC | PRN

## 2017-03-15 MED ORDER — MISOPROSTOL 50MCG HALF TABLET
50.0000 ug | ORAL_TABLET | ORAL | Status: DC | PRN
  Filled 2017-03-15: qty 1

## 2017-03-15 MED ORDER — ZOLPIDEM TARTRATE 5 MG PO TABS: 5.0000 mg | ORAL_TABLET | Freq: Every evening | ORAL | Status: DC | PRN

## 2017-03-15 NOTE — Progress Notes (Signed)
   Meghan Frederick is a 32 y.o. G1P0 at 3957w3d  admitted for rupture of membranes on 2-16- at 2 am.   Subjective: Coping well; comfortable with epidural   Objective: Vitals:   03/15/17 0900 03/15/17 0930 03/15/17 1000 03/15/17 1035  BP: 105/70 116/85 97/84 (!) 107/56  Pulse: 68 75 66 65  Resp: 16 16 16 16   Temp:      TempSrc:      Weight:      Height:       Total I/O In: -  Out: 400 [Urine:400]  FHT:  FHR: 130 bpm, variability: moderate,  accelerations:  Present,  decelerations:  Present early decelerations and variables.  UC:   q2-3 SVE:   Dilation: 10 Effacement (%): 100 Station: +1 Exam by:: SRussell, RN/MEarly, RN   Labs: Lab Results  Component Value Date   WBC 11.7 (H) 03/15/2017   HGB 11.8 (L) 03/15/2017   HCT 35.3 (L) 03/15/2017   MCV 84.0 03/15/2017   PLT 245 03/15/2017    Assessment / Plan: Spontaneous labor, progressing normally Patient now complete; will labor down.  Labor: Progressing normally Fetal Wellbeing:  Category II Pain Control:  Epidural Anticipated MOD:  NSVD FSE if needed.   Meghan MuseKate Frederick 03/15/2017, 10:55 AM

## 2017-03-15 NOTE — H&P (Signed)
Meghan Frederick is a 32 y.o. female presenting for SROM @0200  at 9363w3d. G1P0.    OB History    Gravida Para Term Preterm AB Living   1             SAB TAB Ectopic Multiple Live Births                 Past Medical History:  Diagnosis Date  . Medical history non-contributory    Past Surgical History:  Procedure Laterality Date  . I&D EXTREMITY Right    Middle finger   Family History: family history includes Cancer in her father and mother. Social History:  reports that she quit smoking about 8 years ago. she has never used smokeless tobacco. She reports that she drinks alcohol. She reports that she does not use drugs.     Maternal Diabetes: No Genetic Screening: Declined Maternal Ultrasounds/Referrals: Normal, placenta previa resolved Fetal Ultrasounds or other Referrals:  None Maternal Substance Abuse:  No Significant Maternal Medications:  None Significant Maternal Lab Results:  None Other Comments:  Late prenatal care in 3rd trimester  ROS Maternal Medical History:  Reason for admission: Rupture of membranes.   Contractions: Onset was 1-2 hours ago.   Frequency: irregular and coupled.   Perceived severity is moderate.    Fetal activity: Perceived fetal activity is normal.      Dilation: 1 Effacement (%): 70 Station: -2 Exam by:: lauren fields rn  Blood pressure 128/85, pulse 67, temperature 98.6 F (37 C), temperature source Oral, resp. rate 20, height 5\' 4"  (1.626 m), weight 138 lb (62.6 kg), last menstrual period 06/05/2016. Maternal Exam:  Uterine Assessment: Contraction strength is moderate.  Contraction duration is 45 seconds. Contraction frequency is irregular.   Abdomen: Patient reports no abdominal tenderness. Fetal presentation: vertex  Introitus: Normal vulva. Normal vagina.  Ferning test: positive.   Pelvis: adequate for delivery.     Baseline NST: reactive, + accels, no decels.   Physical Exam  Vitals reviewed. Constitutional: She is  oriented to person, place, and time. She appears well-developed and well-nourished.  HENT:  Head: Normocephalic.  Neck: Normal range of motion.  GI: Soft.  Genitourinary: Vagina normal.  Musculoskeletal: Normal range of motion.  Neurological: She is alert and oriented to person, place, and time.  Skin: Skin is warm and dry.  Psychiatric: She has a normal mood and affect. Her behavior is normal. Judgment and thought content normal.    Prenatal labs: ABO, Rh: --/--/O POS (02/16 0310) Antibody: NEG (02/16 0310) Rubella: 1.13 (08/14 0959) RPR: Non Reactive (12/19 1448)  HBsAg: Negative (08/14 0959)  HIV: Non Reactive (12/19 1448)  GBS: Positive (01/18 0000)   Assessment/Plan: Admit.  Plan for pitocin if contractions decrease.  Contractions every 3-5 minutes.  Plan for pain management: IVP Fentanyl.   GBS+: antibiotics infusing.   Roe Coombsachelle A Kaesen Rodriguez, CNM 03/15/2017, 4:22 AM

## 2017-03-15 NOTE — MAU Note (Signed)
Pt presents to MAU via EMS c/o SROM @ 0200. Pt reports +FM. Denies Vaginal bleeding.

## 2017-03-15 NOTE — Progress Notes (Signed)
   Meghan Frederick is a 32 y.o. G1P0 at [redacted]w[redacted]d  admitted for rupture of membranes on 2-16- at 2 am.   Subjective: Coping well; comfortable with epidural   Objective: Vitals:   03/15/17 0815 03/15/17 0825 03/15/17 0830 03/15/17 0900  BP: 116/71 110/68 116/73 105/70  Pulse: 74 68 68 68  Resp: 18 16 16 16   Temp:      TempSrc:      Weight:      Height:       No intake/output data recorded.  FHT:  FHR: 135 bpm, variability: moderate,  accelerations:  Present,  decelerations:  Present early decelerations and variables.  UC:   q2-3 SVE:   Dilation: 10 Effacement (%): 100 Station: +1 Exam by:: SRussell, RN/MEarly, RN   Labs: Lab Results  Component Value Date   WBC 11.7 (H) 03/15/2017   HGB 11.8 (L) 03/15/2017   HCT 35.3 (L) 03/15/2017   MCV 84.0 03/15/2017   PLT 245 03/15/2017    Assessment / Plan: Spontaneous labor, progressing normally Patient now complete; will labor down.  Labor: Progressing normally Fetal Wellbeing:  Category II Pain Control:  Epidural Anticipated MOD:  NSVD Continue watching tracing closely; will put on FSE if necessary. Patient continously repositioned and RN at the bedside.   Meghan Frederick 03/15/2017, 9:10 AM

## 2017-03-15 NOTE — Anesthesia Preprocedure Evaluation (Signed)
Anesthesia Evaluation  Patient identified by MRN, date of birth, ID band Patient awake    Reviewed: Allergy & Precautions, Patient's Chart, lab work & pertinent test results  Airway Mallampati: II  TM Distance: >3 FB Neck ROM: Full    Dental no notable dental hx. (+) Teeth Intact   Pulmonary former smoker,    Pulmonary exam normal breath sounds clear to auscultation       Cardiovascular negative cardio ROS Normal cardiovascular exam Rhythm:Regular Rate:Normal     Neuro/Psych PSYCHIATRIC DISORDERS Depression    GI/Hepatic Neg liver ROS, GERD  ,  Endo/Other  negative endocrine ROS  Renal/GU negative Renal ROS  negative genitourinary   Musculoskeletal negative musculoskeletal ROS (+)   Abdominal   Peds  Hematology  (+) anemia ,   Anesthesia Other Findings   Reproductive/Obstetrics (+) Pregnancy                             Anesthesia Physical Anesthesia Plan  ASA: II  Anesthesia Plan: Epidural   Post-op Pain Management:    Induction:   PONV Risk Score and Plan:   Airway Management Planned: Natural Airway  Additional Equipment:   Intra-op Plan:   Post-operative Plan:   Informed Consent: I have reviewed the patients History and Physical, chart, labs and discussed the procedure including the risks, benefits and alternatives for the proposed anesthesia with the patient or authorized representative who has indicated his/her understanding and acceptance.     Plan Discussed with: Anesthesiologist  Anesthesia Plan Comments:         Anesthesia Quick Evaluation

## 2017-03-15 NOTE — Anesthesia Procedure Notes (Signed)
Epidural Patient location during procedure: OB Start time: 03/15/2017 7:29 AM  Staffing Anesthesiologist: Mal AmabileFoster, Nidia Grogan, MD Performed: anesthesiologist   Preanesthetic Checklist Completed: patient identified, site marked, surgical consent, pre-op evaluation, timeout performed, IV checked, risks and benefits discussed and monitors and equipment checked  Epidural Patient position: sitting Prep: site prepped and draped and DuraPrep Patient monitoring: continuous pulse ox and blood pressure Approach: midline Location: L3-L4 Injection technique: LOR air  Needle:  Needle type: Tuohy  Needle gauge: 17 G Needle length: 9 cm and 9 Needle insertion depth: 4 cm Catheter type: closed end flexible Catheter size: 19 Gauge Catheter at skin depth: 9 cm Test dose: negative and Other  Assessment Events: blood not aspirated, injection not painful, no injection resistance, negative IV test and no paresthesia  Additional Notes Patient identified. Risks and benefits discussed including failed block, incomplete  Pain control, post dural puncture headache, nerve damage, paralysis, blood pressure Changes, nausea, vomiting, reactions to medications-both toxic and allergic and post Partum back pain. All questions were answered. Patient expressed understanding and wished to proceed. Sterile technique was used throughout procedure. Epidural site was Dressed with sterile barrier dressing. No paresthesias, signs of intravascular injection Or signs of intrathecal spread were encountered.  Patient was more comfortable after the epidural was dosed. Please see RN's note for documentation of vital signs and FHR which are stable.

## 2017-03-15 NOTE — Anesthesia Postprocedure Evaluation (Signed)
Anesthesia Post Note  Patient: Meghan Frederick  Procedure(s) Performed: AN AD HOC LABOR EPIDURAL     Patient location during evaluation: Mother Baby Anesthesia Type: Epidural Level of consciousness: awake and alert and oriented Pain management: satisfactory to patient Vital Signs Assessment: post-procedure vital signs reviewed and stable Respiratory status: respiratory function stable Cardiovascular status: stable Postop Assessment: no headache, no backache, epidural receding, patient able to bend at knees, no signs of nausea or vomiting and adequate PO intake Anesthetic complications: no    Last Vitals:  Vitals:   03/15/17 1250 03/15/17 1332  BP: 114/74 123/82  Pulse: 70 64  Resp: 16 16  Temp:  36.9 C  SpO2:  100%    Last Pain:  Vitals:   03/15/17 1350  TempSrc:   PainSc: 0-No pain   Pain Goal: Patients Stated Pain Goal: 0 (03/15/17 0258)               Karleen DolphinFUSSELL,Liv Rallis

## 2017-03-16 LAB — CBC
HEMATOCRIT: 31 % — AB (ref 36.0–46.0)
HEMOGLOBIN: 10.2 g/dL — AB (ref 12.0–15.0)
MCH: 27.8 pg (ref 26.0–34.0)
MCHC: 32.9 g/dL (ref 30.0–36.0)
MCV: 84.5 fL (ref 78.0–100.0)
Platelets: 194 10*3/uL (ref 150–400)
RBC: 3.67 MIL/uL — ABNORMAL LOW (ref 3.87–5.11)
RDW: 14.2 % (ref 11.5–15.5)
WBC: 13.6 10*3/uL — ABNORMAL HIGH (ref 4.0–10.5)

## 2017-03-16 MED ORDER — IBUPROFEN 100 MG/5ML PO SUSP
600.0000 mg | Freq: Four times a day (QID) | ORAL | Status: DC
  Administered 2017-03-16 – 2017-03-17 (×5): 600 mg via ORAL
  Filled 2017-03-16 (×10): qty 30

## 2017-03-16 NOTE — Progress Notes (Signed)
Post Partum Day 1 Subjective: no complaints, up ad lib, voiding, tolerating PO and + flatus  Objective: Blood pressure 112/60, pulse 61, temperature 98.1 F (36.7 C), temperature source Oral, resp. rate 18, height 5\' 4"  (1.626 m), weight 138 lb (62.6 kg), last menstrual period 06/05/2016, SpO2 100 %, unknown if currently breastfeeding.  Physical Exam:  General: alert, cooperative, appears stated age and no distress Lochia: appropriate Uterine Fundus: firm Incision: n/a DVT Evaluation: No evidence of DVT seen on physical exam.  Recent Labs    03/15/17 0310 03/16/17 0535  HGB 11.8* 10.2*  HCT 35.3* 31.0*    Assessment/Plan: Plan for discharge tomorrow   LOS: 1 day   Meghan Frederick 03/16/2017, 8:51 AM

## 2017-03-17 MED ORDER — MEDROXYPROGESTERONE ACETATE 150 MG/ML IM SUSP
150.0000 mg | Freq: Once | INTRAMUSCULAR | Status: AC
  Administered 2017-03-17: 150 mg via INTRAMUSCULAR
  Filled 2017-03-17: qty 1

## 2017-03-17 MED ORDER — IBUPROFEN 100 MG/5ML PO SUSP: 600.0000 mg | Freq: Four times a day (QID) | ORAL | 0 refills | Status: DC

## 2017-03-17 NOTE — Discharge Summary (Signed)
OB Discharge Summary     Patient Name: Meghan Frederick DOB: [birth year] MRN: [id number]  Date of admission: 03/15/2017 Delivering MD: Charise Killian ANTONSEN   Date of discharge: 03/17/2017  Admitting diagnosis: 40 wks water broke and ctx Intrauterine pregnancy: [redacted]w[redacted]d     Secondary diagnosis:  Active Problems:   Post term pregnancy at [redacted] weeks gestation  Additional problems: none     Discharge diagnosis: Term Pregnancy Delivered                                                                                                Post partum procedures:none  Augmentation: none  Complications: None  Hospital course:  Onset of Labor With Vaginal Delivery     32 y.o. yo G1P1001 at [redacted]w[redacted]d was admitted in Active Labor on 03/15/2017. Patient had an uncomplicated labor course as follows:  Membrane Rupture Time/Date: 2:00 AM ,03/15/2017   Intrapartum Procedures: Episiotomy: None [1]                                         Lacerations:  Periurethral [8]  Patient had a delivery of a Viable infant. 03/15/2017  Information for the patient's newborn:  Coretha, [place] Girl Kynzee [[id number]]  Delivery Method: Vag-Spont    Pateint had an uncomplicated postpartum course.  She is ambulating, tolerating a regular diet, passing flatus, and urinating well. Patient is discharged home in stable condition on 03/17/17.   Physical exam  Vitals:   03/15/17 1743 03/16/17 0513 03/16/17 1758 03/17/17 0500  BP: 125/75 112/60 120/64 112/68  Pulse: 63 61 67 (!) 51  Resp: 19 18 18 18   Temp: 98.2 F (36.8 C) 98.1 F (36.7 C) 99 F (37.2 C) 98.6 F (37 C)  TempSrc: Oral Oral Oral Oral  SpO2:      Weight:      Height:       General: alert, cooperative and no distress Lochia: appropriate Uterine Fundus: firm Incision: N/A DVT Evaluation: No evidence of DVT seen on physical exam. Negative Homan's sign. No cords or calf tenderness. No significant calf/ankle edema. Labs: Lab Results  Component Value Date   WBC  13.6 (H) 03/16/2017   HGB 10.2 (L) 03/16/2017   HCT 31.0 (L) 03/16/2017   MCV 84.5 03/16/2017   PLT 194 03/16/2017   CMP Latest Ref Rng & Units 09/15/2016  Glucose 65 - 99 mg/dL 98  BUN 6 - 20 mg/dL 7  Creatinine 7.82 - 9.56 mg/dL 2.13  Sodium 086 - 578 mmol/L 135  Potassium 3.5 - 5.1 mmol/L 3.8  Chloride 101 - 111 mmol/L 106  CO2 22 - 32 mmol/L 23  Calcium 8.9 - 10.3 mg/dL 9.0  Total Protein 6.5 - 8.1 g/dL -  Total Bilirubin 0.3 - 1.2 mg/dL -  Alkaline Phos 38 - 469 U/L -  AST 15 - 41 U/L -  ALT 14 - 54 U/L -    Discharge instruction: per After Visit Summary and "Baby and Me Booklet".  After visit  meds:  Allergies as of 03/17/2017      Reactions   Dust Mite Extract Itching, Other (See Comments)   Runny nose, sore throat   Pollen Extract Itching   Other Rash   Green peppers      Medication List    TAKE these medications   acetaminophen 325 MG tablet Commonly known as:  TYLENOL Take 650 mg by mouth every 6 (six) hours as needed for mild pain or moderate pain.   diphenhydrAMINE 12.5 MG/5ML elixir Commonly known as:  BENADRYL Take 12.5 mg by mouth daily as needed for allergies.   diphenhydramine-acetaminophen 25-500 MG Tabs tablet Commonly known as:  TYLENOL PM Take 1 tablet by mouth at bedtime as needed (For pain.).   ibuprofen 100 MG/5ML suspension Commonly known as:  ADVIL,MOTRIN Take 30 mLs (600 mg total) by mouth every 6 (six) hours.   Prenatal Vitamin 27-0.8 MG Tabs Take 1 tablet by mouth daily.       Diet: routine diet  Activity: Advance as tolerated. Pelvic rest for 6 weeks.   Outpatient follow up:6 weeks Follow up Appt:No future appointments. Follow up Visit:No Follow-up on file.  Postpartum contraception: Depo Provera prior to d/c home  Newborn Data: Live born female  Birth Weight: 6 lb 12.8 oz (3084 g) APGAR: 8, 9  Newborn Delivery   Birth date/time:  03/15/2017 11:26:00 Delivery type:  Vaginal, Spontaneous     Baby Feeding: Bottle  and Breast Disposition:home with mother   03/17/2017 Raelyn Moraolitta Zell Hylton, CNM

## 2017-03-17 NOTE — Progress Notes (Signed)
Post Partum Day 2 Subjective: no complaints, up ad lib, voiding and tolerating PO  Objective: Blood pressure 112/68, pulse (!) 51, temperature 98.6 F (37 C), temperature source Oral, resp. rate 18, height 5\' 4"  (1.626 m), weight 138 lb (62.6 kg), last menstrual period 06/05/2016, SpO2 100 %, currently breastfeeding.  Physical Exam:  General: alert, cooperative and no distress Lochia: appropriate Uterine Fundus: firm Incision: N/A DVT Evaluation: No evidence of DVT seen on physical exam. Negative Homan's sign. No cords or calf tenderness. No significant calf/ankle edema.  Recent Labs    03/15/17 0310 03/16/17 0535  HGB 11.8* 10.2*  HCT 35.3* 31.0*    Assessment/Plan: Discharge home and Breastfeeding   LOS: 2 days   Meghan Frederick Shake 03/17/2017, 7:32 AM

## 2017-03-17 NOTE — Lactation Note (Signed)
This note was copied from a baby's chart. Lactation Consultation Note  Patient Name: Meghan Frederick FAOZH'YToday's Date: 03/17/2017 Reason for consult: Initial assessment;1st time breastfeeding;Primapara;Term;Nipple pain/trauma  Visited with P1 Mom of term baby at 3346 hrs old.  Baby at 7% weight loss.  Mom reports breastfeeding well, with some soreness.  Right nipple abraded on tip, left nipple no skin breakdown.  Coconut Oil given with instructions  Full assist in football hold on right side.  Baby latched after two attempts deeply onto breast.  Taught Mom how to do alternate breast compression.  Swallowing identified occasionally.   Hand expression demonstrated and encouraged Mom to hand express into spoon and feed baby her colostrum. Mom independently latched baby on left breast in football hold.   Basics reviewed.  Encouraged Mom to keep baby STS and feed baby often on cue.  Goal of 8-12 feedings per 24 hrs.  Lactation brochure given to Mom.  Mom aware of OP lactation services available to her.   Encouraged to call prn.  Maternal Data Formula Feeding for Exclusion: No Has patient been taught Hand Expression?: Yes Does the patient have breastfeeding experience prior to this delivery?: No  Feeding Feeding Type: Breast Fed Length of feed: 20 min  LATCH Score Latch: Grasps breast easily, tongue down, lips flanged, rhythmical sucking.  Audible Swallowing: Spontaneous and intermittent  Type of Nipple: Everted at rest and after stimulation  Comfort (Breast/Nipple): Filling, red/small blisters or bruises, mild/mod discomfort  Hold (Positioning): No assistance needed to correctly position infant at breast.  LATCH Score: 9  Interventions Interventions: Breast feeding basics reviewed;Assisted with latch;Skin to skin;Breast massage;Hand express;Breast compression;Adjust position;Support pillows;Position options;Expressed milk;Coconut oil;Hand pump  Lactation Tools Discussed/Used Tools:  Coconut oil;Pump Breast pump type: Manual WIC Program: Yes Pump Review: Setup, frequency, and cleaning;Milk Storage Initiated by:: Erby Pian Ashly Goethe RN IBCLC Date initiated:: 03/17/17   Consult Status Consult Status: Complete    Meghan Frederick, Meghan Frederick 03/17/2017, 9:40 AM

## 2017-03-19 ENCOUNTER — Inpatient Hospital Stay (HOSPITAL_COMMUNITY): Payer: Medicaid Other

## 2017-04-09 ENCOUNTER — Encounter: Payer: Self-pay | Admitting: Family Medicine

## 2017-04-24 ENCOUNTER — Ambulatory Visit: Payer: Medicaid Other | Admitting: Student

## 2017-06-27 ENCOUNTER — Ambulatory Visit: Payer: Medicaid Other

## 2017-06-27 ENCOUNTER — Ambulatory Visit (INDEPENDENT_AMBULATORY_CARE_PROVIDER_SITE_OTHER): Payer: Medicaid Other

## 2017-06-27 DIAGNOSIS — Z3042 Encounter for surveillance of injectable contraceptive: Secondary | ICD-10-CM | POA: Diagnosis not present

## 2017-06-27 DIAGNOSIS — Z3200 Encounter for pregnancy test, result unknown: Secondary | ICD-10-CM | POA: Diagnosis present

## 2017-06-27 LAB — POCT URINE PREGNANCY: Preg Test, Ur: NEGATIVE

## 2017-06-27 MED ORDER — MEDROXYPROGESTERONE ACETATE 150 MG/ML IM SUSY
150.0000 mg | PREFILLED_SYRINGE | Freq: Once | INTRAMUSCULAR | Status: AC
  Administered 2017-06-27: 150 mg via INTRAMUSCULAR

## 2017-06-27 NOTE — Progress Notes (Signed)
Patient here today for Depo Provera injection.  Last Depo given on 03/17/17 and is late for Depo.  Obtained urine pregnancy test today and is negative.  Patient states last sexual activity was 11 months ago Depo given today L deltoid.  Site unremarkable & patient tolerated injection.  Next injection due August 16-30 and reminder card given.  Patient instructed to use protection x one week. Instructed to return for nurse visit in one week for repeat pregnancy test per office protocol.  Patient verbalized understanding.    Shawna Orleans, RN

## 2017-10-13 ENCOUNTER — Ambulatory Visit (INDEPENDENT_AMBULATORY_CARE_PROVIDER_SITE_OTHER): Payer: Medicaid Other | Admitting: *Deleted

## 2017-10-13 DIAGNOSIS — Z3403 Encounter for supervision of normal first pregnancy, third trimester: Secondary | ICD-10-CM | POA: Diagnosis not present

## 2017-10-13 DIAGNOSIS — Z3042 Encounter for surveillance of injectable contraceptive: Secondary | ICD-10-CM

## 2017-10-13 LAB — POCT URINE PREGNANCY: Preg Test, Ur: NEGATIVE

## 2017-10-13 MED ORDER — MEDROXYPROGESTERONE ACETATE 150 MG/ML IM SUSY
150.0000 mg | PREFILLED_SYRINGE | Freq: Once | INTRAMUSCULAR | Status: AC
  Administered 2017-10-13: 150 mg via INTRAMUSCULAR

## 2017-10-13 NOTE — Progress Notes (Signed)
Patient here today for Depo Provera injection.  Depo given today RUOQ.  Site unremarkable & patient tolerated injection.  Next injection due 12/29/17 - 01/12/18.  Reminder card given.    Rogers Ditter, Maryjo RochesterJessica Dawn, CMA

## 2017-12-29 IMAGING — US US MFM OB FOLLOW-UP
1 series · 14 of 28 positions shown · non-contrast
Comparison: none

[Series 1: us mfm ob follow-up · 42 acquisitions, 14 frames shown]
[im 2/42]
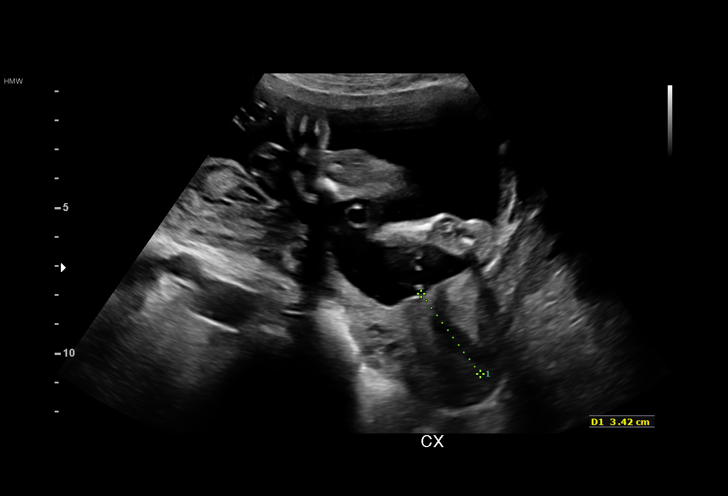
[im 5/42]
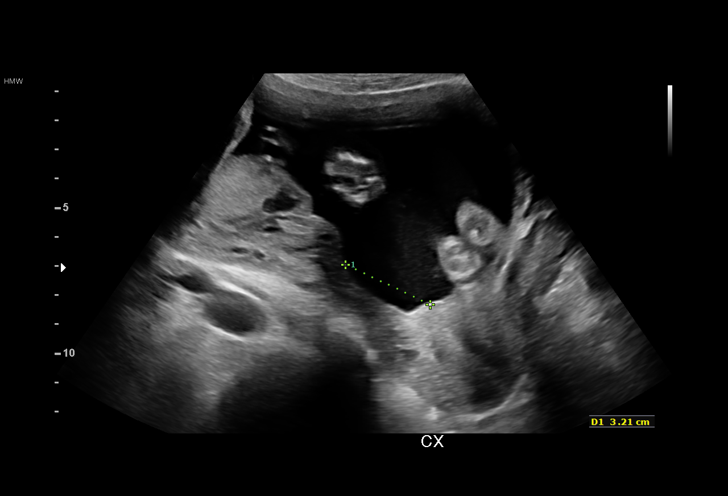
[im 8/42]
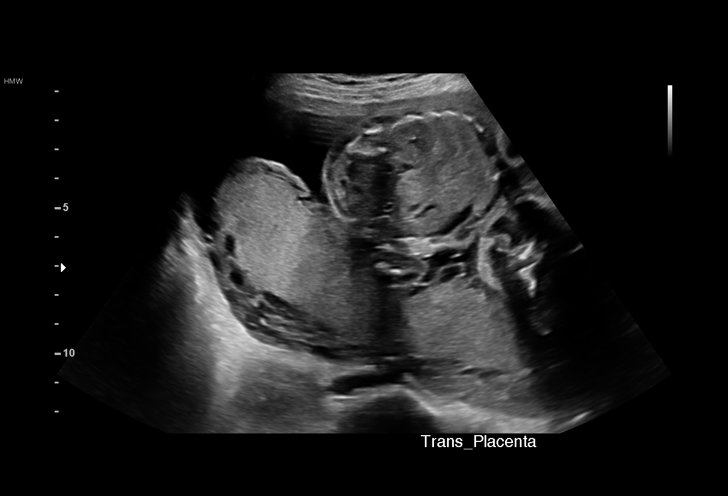
[im 11/42]
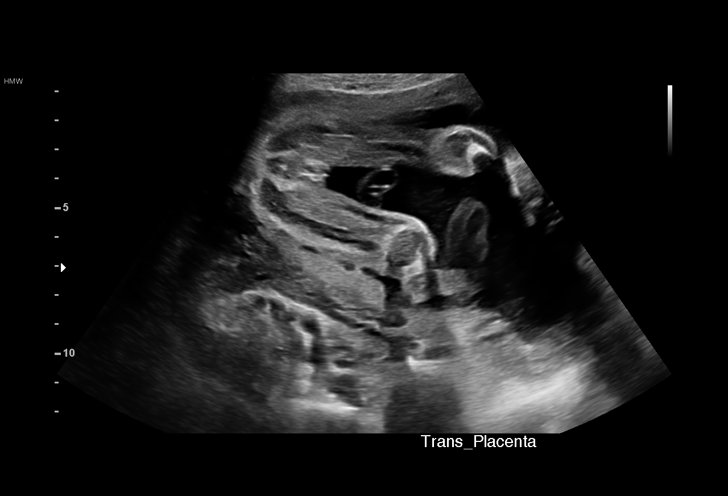
[im 14/42]
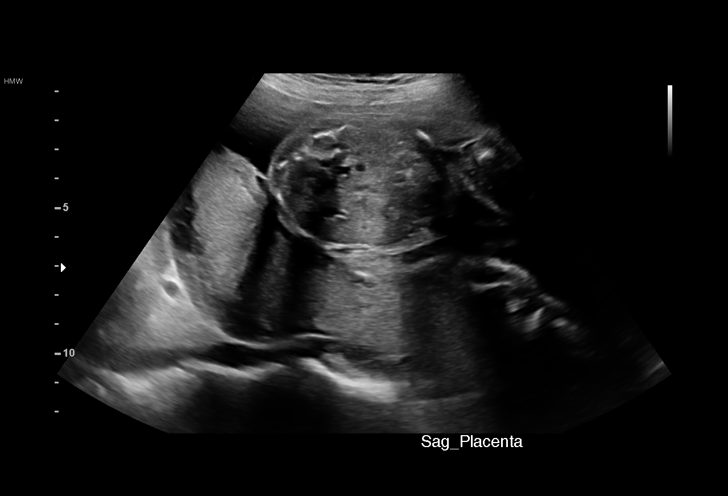
[im 17/42]
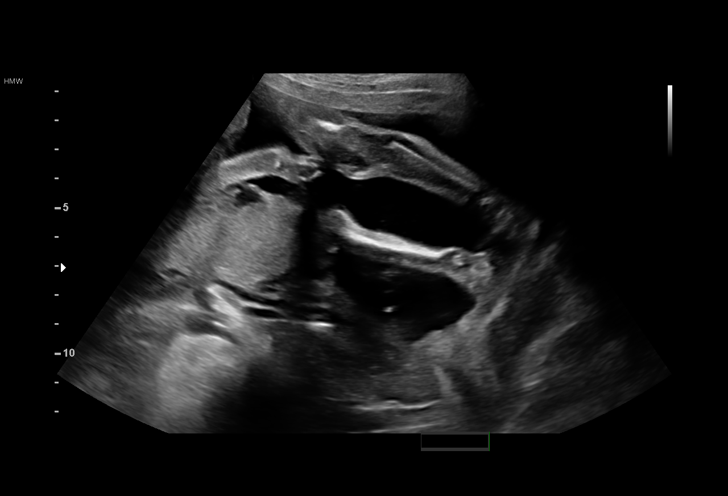
[im 20/42]
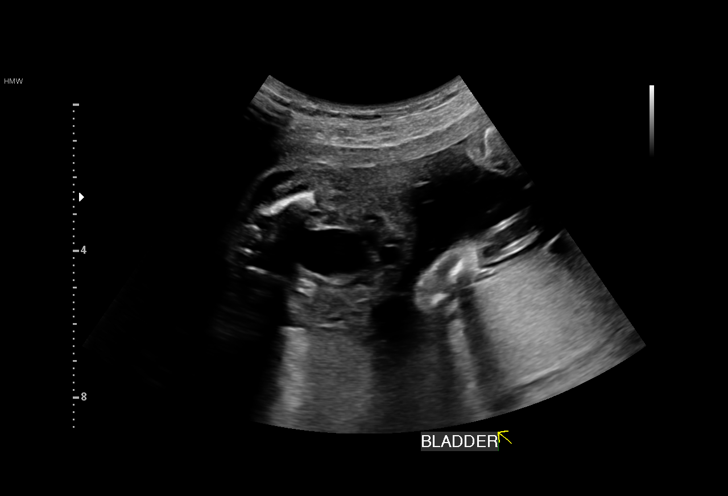
[im 23/42]
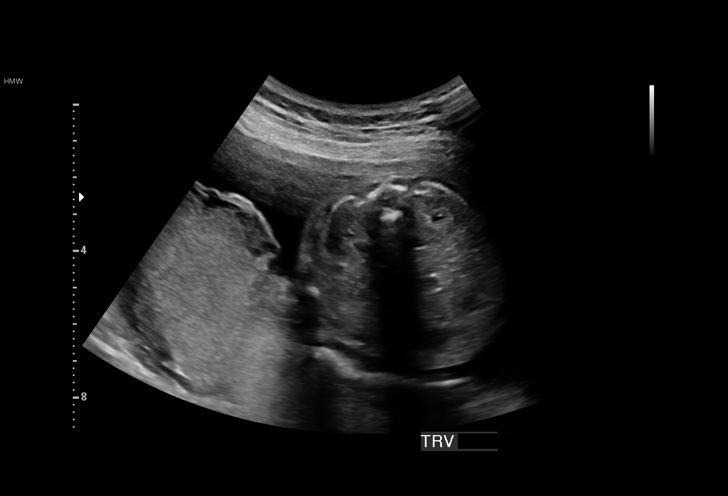
[im 26/42]
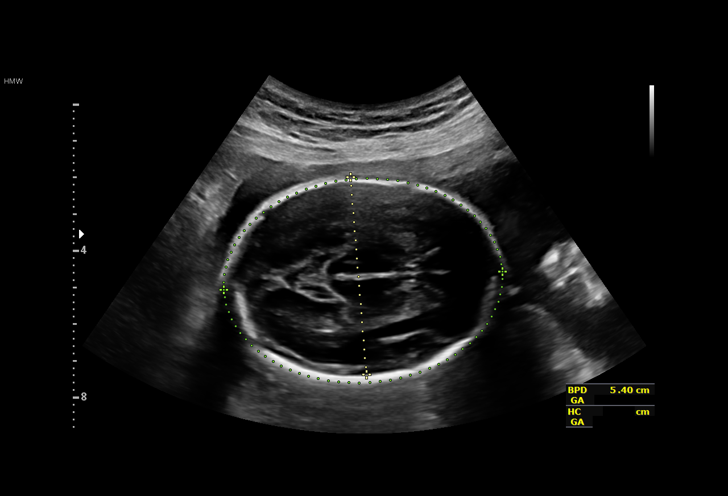
[im 29/42]
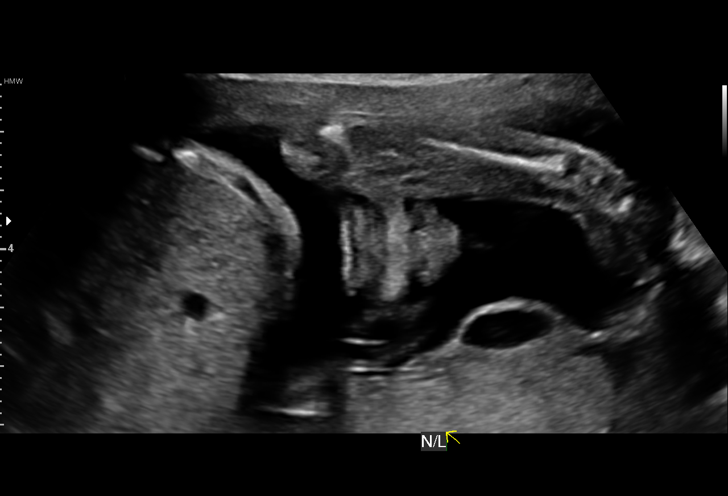
[im 32/42]
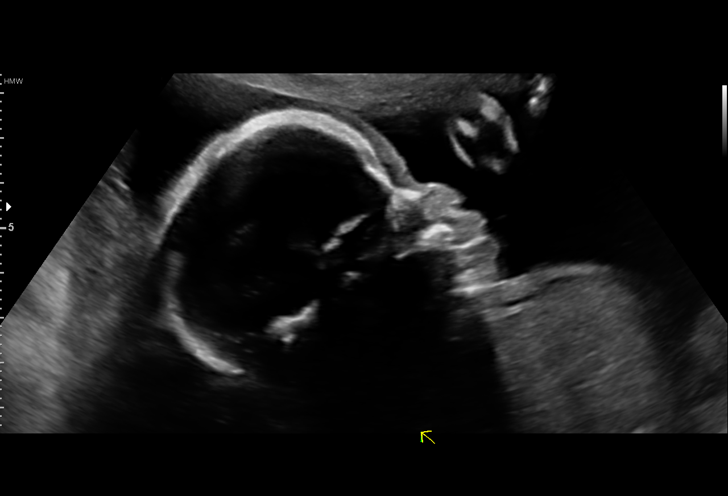
[im 35/42]
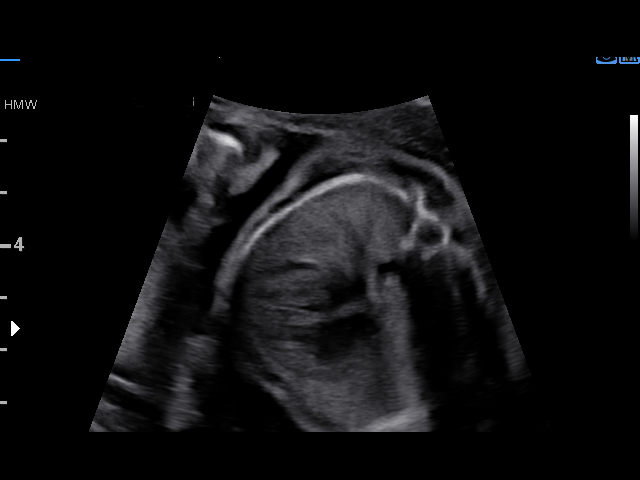
[im 38/42]
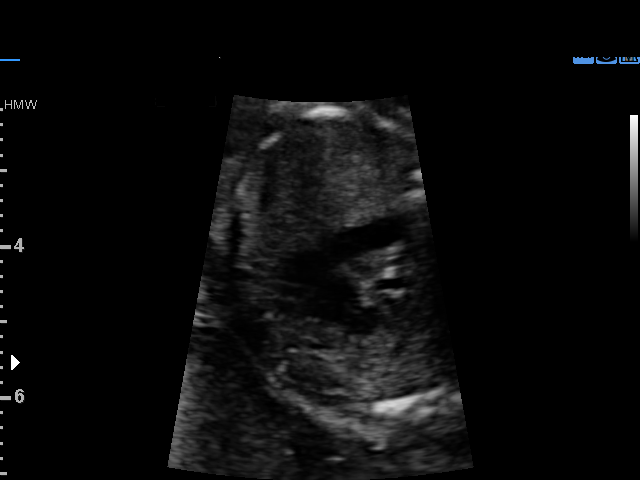
[im 42/42]
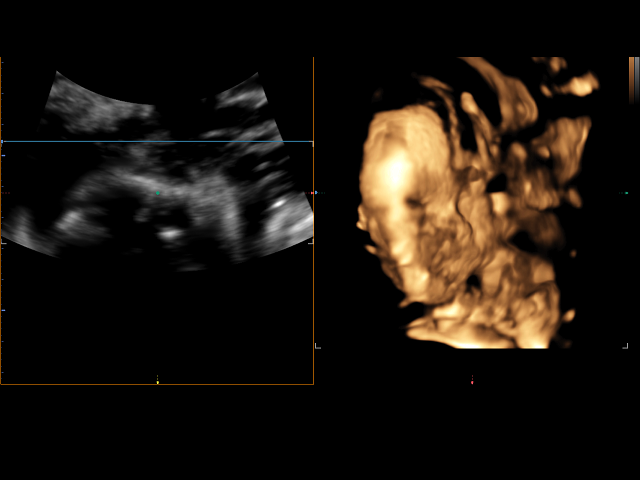

[14 of 28 positions shown; findings below may reference images not displayed]

[REDACTED]

1  KUNDE                 [id number]      [id number]     [id number]
BASETSANA
Indications

24 weeks gestation of pregnancy
Placenta previa specified as without
hemorrhage, second trimester
Encounter for other antenatal screening
follow-up
OB History

Gravidity:    1         Term:   0        Prem:   0         SAB:   0
TOP:          0       Ectopic:  0        Living: 0
Fetal Evaluation

Num Of Fetuses:     1
Fetal Heart         153
Rate(bpm):
Cardiac Activity:   Observed
Presentation:       Breech
Placenta:           Posterior, above cervical os
P. Cord Insertion:  Previously Visualized

Amniotic Fluid
AFI FV:      Subjectively within normal limits
Biometry

BPD:      54.5  mm     G. Age:  22w 4d          6  %    CI:         68.82  %    70 - 86
FL/HC:       20.6  %    18.7 -
HC:      209.9  mm     G. Age:  23w 1d          8  %    HC/AC:       1.15       1.05 -
AC:      181.8  mm     G. Age:  23w 0d         16  %    FL/BPD:      79.4  %    71 - 87
FL:       43.3  mm     G. Age:  24w 2d         44  %    FL/AC:       23.8  %    20 - 24
HUM:      40.2  mm     G. Age:  24w 3d         52  %
Est. FW:     595   gm     1 lb 5 oz     40  %
Gestational Age

LMP:           24w 0d        Date:  06/05/16                 EDD:    03/12/17
U/S Today:     23w 2d                                        EDD:    03/17/17
Best:          24w 0d     Det. By:  LMP  (06/05/16)          EDD:    03/12/17
Anatomy

Cranium:               Appears normal         Aortic Arch:            Previously seen
Cavum:                 Previously seen        Ductal Arch:            Previously seen
Ventricles:            Appears normal         Diaphragm:              Previously seen
Choroid Plexus:        Previously seen        Stomach:                Appears normal, left
sided
Cerebellum:            Previously seen        Abdomen:                Previously seen
Posterior Fossa:       Previously seen        Abdominal Wall:         Previously seen
Nuchal Fold:           Previously seen        Cord Vessels:           Previously seen
Face:                  Orbits and profile     Kidneys:                Appear normal
previously seen
Lips:                  Appears normal         Bladder:                Appears normal
Thoracic:              Appears normal         Spine:                  Previously seen
Heart:                 Appears normal         Upper Extremities:      Previously seen
(4CH, axis, and situs
RVOT:                  Appears normal         Lower Extremities:      Previously seen
LVOT:                  Appears normal

Other:  Fetus appears to be a female. Heels visualized previously.
Cervix Uterus Adnexa

Cervix
Length:            3.4  cm.
Normal appearance by transabdominal scan.

Uterus
No abnormality visualized.

Left Ovary
No adnexal mass visualized.

Right Ovary
No adnexal mass visualized.

Cul De Sac:   No free fluid seen.

Adnexa:       No abnormality visualized.
Impression

SIUP at 24+0 weeks
Normal interval anatomy; anatomic survey complete
Normal amniotic fluid volume
Appropriate interval growth with EFW at the 40th %tile; AC at
the 16th %tile
Posterior placenta; no previa
Recommendations

Follow-up ultrasound for growth in 6 weeks (underwt)

## 2018-01-03 ENCOUNTER — Ambulatory Visit (HOSPITAL_COMMUNITY)
Admission: EM | Admit: 2018-01-03 | Discharge: 2018-01-03 | Disposition: A | Discharge status: Home / Self Care | Payer: Medicaid Other | Attending: Family Medicine | Admitting: Family Medicine

## 2018-01-03 ENCOUNTER — Encounter (HOSPITAL_COMMUNITY): Payer: Self-pay | Admitting: Emergency Medicine

## 2018-01-03 DIAGNOSIS — J069 Acute upper respiratory infection, unspecified: Secondary | ICD-10-CM

## 2018-01-03 DIAGNOSIS — B9789 Other viral agents as the cause of diseases classified elsewhere: Secondary | ICD-10-CM | POA: Diagnosis not present

## 2018-01-03 MED ORDER — CETIRIZINE HCL 10 MG PO TABS: 10.0000 mg | ORAL_TABLET | Freq: Every day | ORAL | 11 refills | Status: DC

## 2018-01-03 MED ORDER — BENZONATATE 100 MG PO CAPS: 100.0000 mg | ORAL_CAPSULE | Freq: Three times a day (TID) | ORAL | 0 refills | Status: DC | PRN

## 2018-01-03 NOTE — ED Provider Notes (Signed)
[facility]    CSN: [id number] Arrival date & time: 01/03/18  1433     History   Chief Complaint Chief Complaint  Patient presents with  . URI    HPI  Meghan Frederick is a 32 y.o. female.   HPI  Patient complains of URI symptoms and coughing over the last 1 to 2 days. Persistent cough which she has nonproductive and sore throat. She is afebrile. Denies denies shortness of breath, wheezing, or chest tightness.  Sick contact infant child who is enrolled in daycare.  She has been on history of environmental allergies.  Past Medical History:  Diagnosis Date  . Medical history non-contributory     Patient Active Problem List   Diagnosis Date Noted  . Post term pregnancy at [redacted] weeks gestation 03/15/2017  . Uterine size date discrepancy pregnancy, third trimester 02/18/2017  . Supervision of normal first pregnancy 09/23/2016  . 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 Right patellofemoral syndrome 03/25/2012  . Pulmonary nodules 12/06/2009  . PLEURISY 12/01/2009  . TOBACCO USER 12/10/2008  . DEPRESSION, MAJOR, RECURRENT 03/27/2006    Past Surgical History:  Procedure Laterality Date  . I&D EXTREMITY Right    Middle finger    OB History    Gravida  1   Para  1   Term  1   Preterm      AB      Living  1     SAB      TAB      Ectopic      Multiple  0   Live Births  1            Home Medications    Prior to Admission medications   Medication Sig Start Date End Date Taking? Authorizing Provider  acetaminophen (TYLENOL) 325 MG tablet Take 650 mg by mouth every 6 (six) hours as needed for mild pain or moderate pain.     [provider]  diphenhydrAMINE (BENADRYL) 12.5 MG/5ML elixir Take 12.5 mg by mouth daily as needed for allergies.    [provider]  diphenhydramine-acetaminophen (TYLENOL PM) 25-500 MG TABS tablet Take 1 tablet by mouth at bedtime as needed (For pain.).    [provider]  ibuprofen (ADVIL,MOTRIN)  100 MG/5ML suspension Take 30 mLs (600 mg total) by mouth every 6 (six) hours. 03/17/17   Raelyn Mora, CNM  Prenatal Vit-Fe Fumarate-FA (PRENATAL VITAMIN) 27-0.8 MG TABS Take 1 tablet by mouth daily. 10/29/16   Lovena Neighbours, MD    Family History Family History  Problem Relation Age of Onset  . Cancer Mother        lung  . Cancer Father        prostate    Social History Social History   Tobacco Use  . Smoking status: Former Smoker    Last attempt to quit: 01/28/2009    Years since quitting: 8.9  . Smokeless tobacco: Never Used  . Tobacco comment: 2011  Substance Use Topics  . Alcohol use: Yes    Comment: social, stopped prior to learning of preg  . Drug use: No     Allergies   Dust mite extract; Pollen extract; and Other   Review of Systems Review of Systems Pertinent negatives listed in HPI  Physical Exam Triage Vital Signs ED Triage Vitals [01/03/18 1626]  Enc Vitals Group     BP 120/69     Pulse Rate 100     Resp 16  Temp 98.8 F (37.1 C)     Temp src      SpO2 100 %     Weight      Height      Head Circumference      Peak Flow      Pain Score 7     Pain Loc      Pain Edu?      Excl. in GC?    No data found.  Updated Vital Signs BP 120/69   Pulse 100   Temp 98.8 F (37.1 C)   Resp 16   SpO2 100%   Breastfeeding? No   Visual Acuity Right Eye Distance:   Left Eye Distance:   Bilateral Distance:    Right Eye Near:   Left Eye Near:    Bilateral Near:     Physical Exam Constitutional: Patient appears well-developed and well-nourished. No distress. HENT: Normocephalic, atraumatic, External right and left ear normal. Oropharynx is clear and moist.  Eyes: Conjunctivae and EOM are normal. PERRLA, no scleral icterus. Neck: Normal ROM. Neck supple. No JVD. No tracheal deviation. No thyromegaly. CVS: RRR, S1/S2 +, no murmurs, no gallops, no carotid bruit.  Pulmonary: Effort and breath sounds normal, no stridor, rhonchi, wheezes, rales.    Skin: Skin is warm and dry. No rash noted. Not diaphoretic. No erythema. No pallor. Psychiatric: Normal mood and affect. Behavior, judgment, thought content normal.  UC Treatments / Results  Labs (all labs ordered are listed, but only abnormal results are displayed) Labs Reviewed - No data to display  EKG None  Radiology No results found.  Procedures Procedures (including critical care time)  Medications Ordered in UC Medications - No data to display  Initial Impression / Assessment and Plan / UC Course  I have reviewed the triage vital signs and the nursing notes.  Pertinent labs & imaging results that were available during my care of the patient were reviewed by me and considered in my medical decision making (see chart for details).  Symptoms are likely viral.  Will take a watch and wait approach for now along with symptomatic treatment.  Will start patient on antihistamine therapy with cetirizine.  Treat cough with benzonatate.  Encouraged hydration and rest.  Follow-up with PCP if symptoms do not improve.  Patient verbalized agreement and understanding of plan.   Final Clinical Impressions(s) / UC Diagnoses   Final diagnoses:  Viral URI with cough     Discharge Instructions     Medication as prescribed.  The symptoms are likely viral and will resolve with management of symptoms.  He need to hydrate well.  If symptoms worsen follow-up with PCP or he can return here.    ED Prescriptions    Medication Sig Dispense Auth. Provider   benzonatate (TESSALON) 100 MG capsule Take 1-2 capsules (100-200 mg total) by mouth 3 (three) times daily as needed for cough. 40 capsule Bing NeighborsHarris, Katasha Riga S, FNP   cetirizine (ZYRTEC) 10 MG tablet Take 1 tablet (10 mg total) by mouth daily. 30 tablet Bing NeighborsHarris, Rudy Luhmann S, FNP     Controlled Substance Prescriptions [place] Controlled Substance Registry consulted? Not Applicable   Bing NeighborsHarris, Juneau Doughman S, FNP 01/07/18 [phone]

## 2018-01-03 NOTE — ED Triage Notes (Signed)
Pt c/o cough, uri, sore throat, states shes been coughing so much her R rib cage hurts.

## 2018-01-03 NOTE — Discharge Instructions (Addendum)
Medication as prescribed.  The symptoms are likely viral and will resolve with management of symptoms.  He need to hydrate well.  If symptoms worsen follow-up with PCP or he can return here.

## 2018-01-12 IMAGING — US US OB TRANSVAGINAL
1 series · 13 of 28 positions shown · non-contrast
Comparison: 04/16/2005 pelvic ultrasound

CLINICAL DATA: 31 y/o  F; lower abdominal pain.  Pregnant patient.

EXAM:
OBSTETRIC <14 WK US AND TRANSVAGINAL OB US
TECHNIQUE: Both transabdominal and transvaginal ultrasound examinations were
performed for complete evaluation of the gestation as well as the
maternal uterus, adnexal regions, and pelvic cul-de-sac.
Transvaginal technique was performed to assess early pregnancy.

[Series 1: us ob transvaginal · 0.23mm/px · 41 acquisitions, 13 frames shown]
[im 2/41]
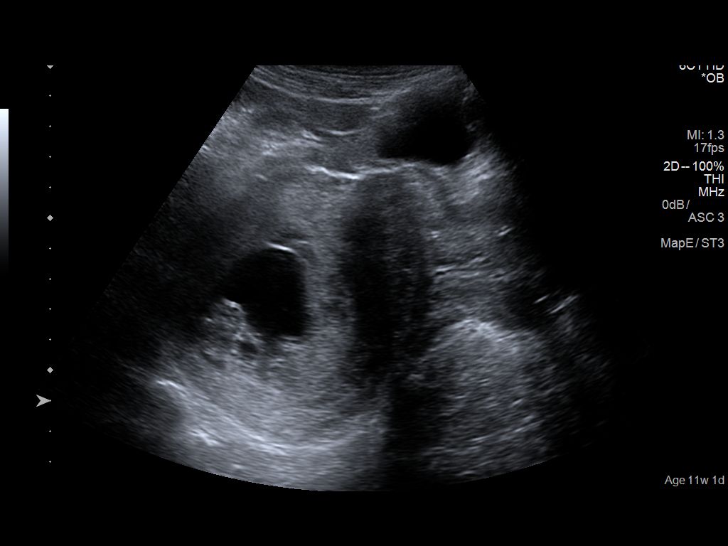
[im 5/41]
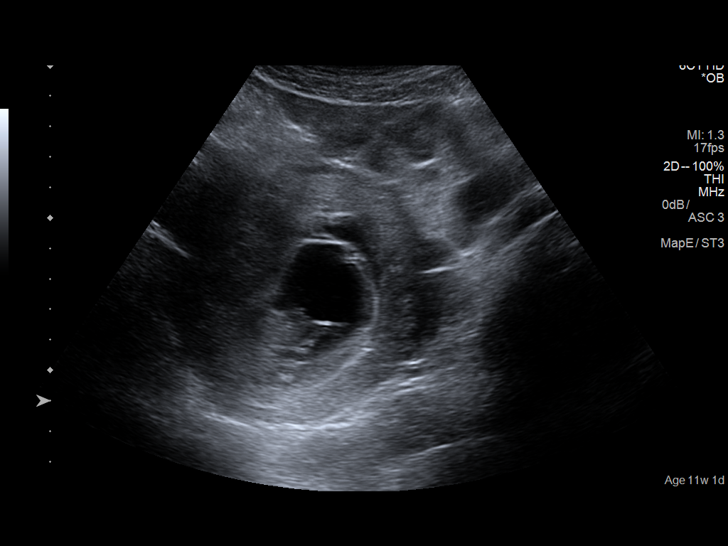
[im 8/41]
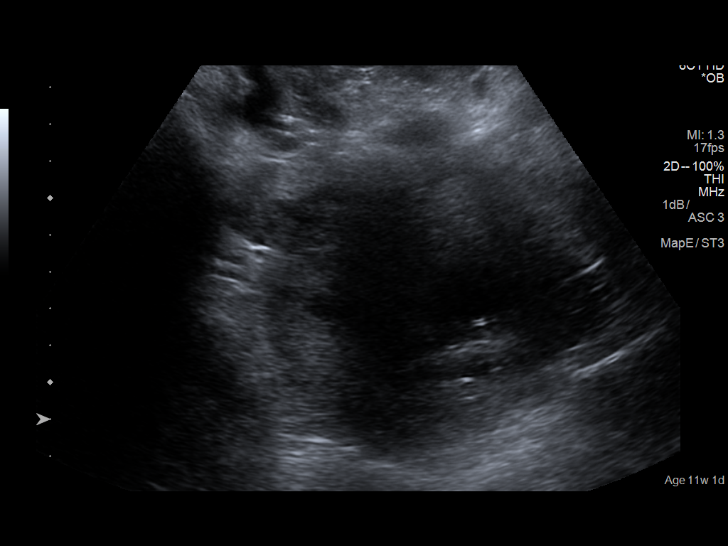
[im 11/41]
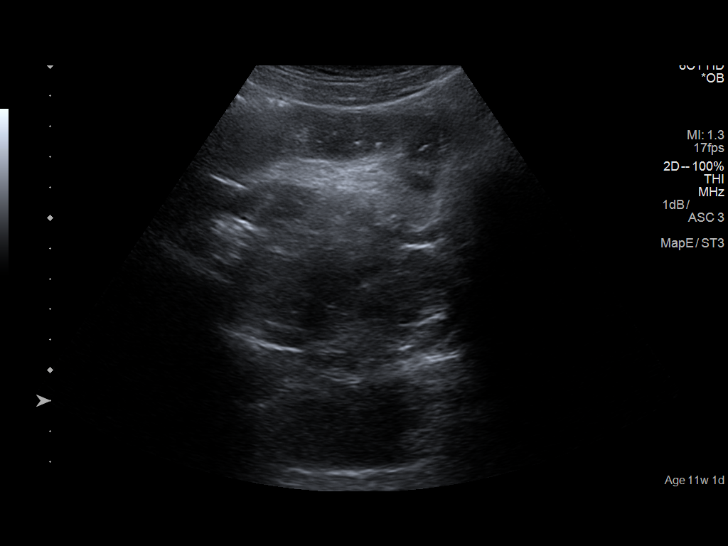
[im 14/41]
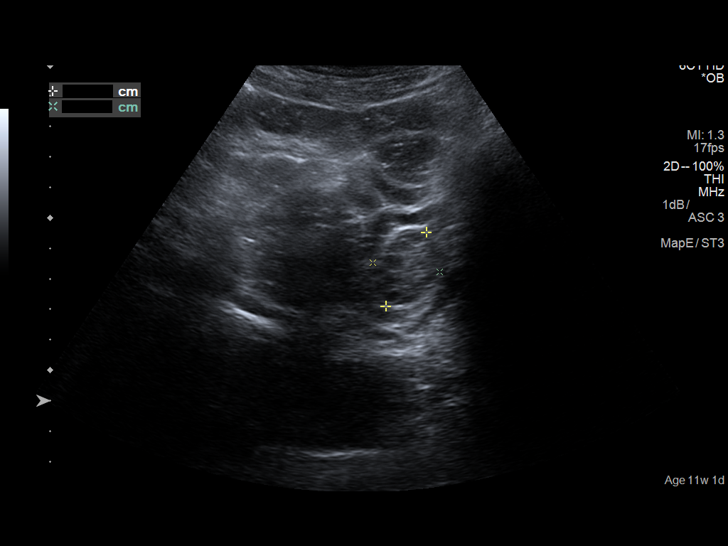
[im 17/41]
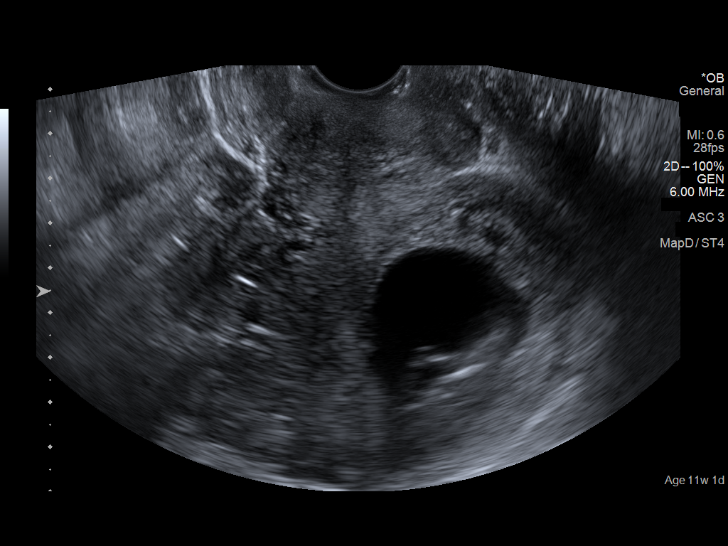
[im 21/41]
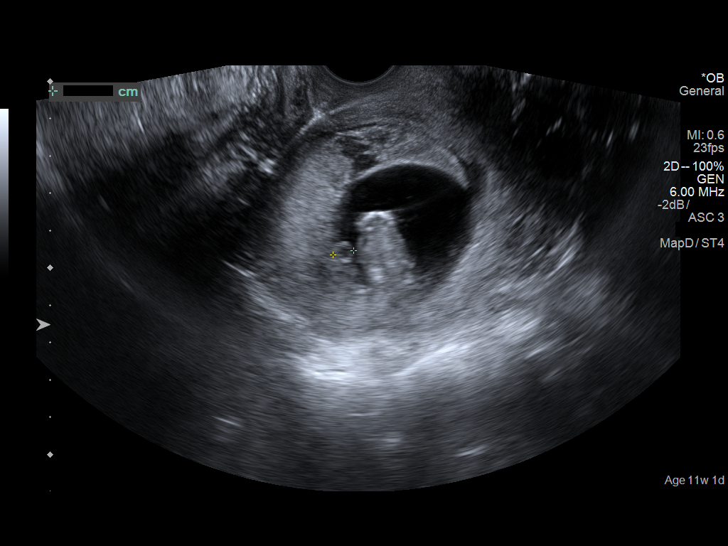
[im 24/41]
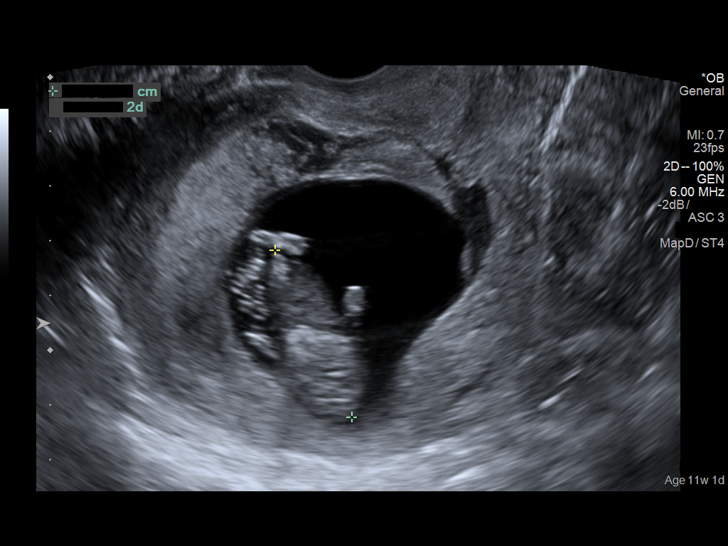
[im 27/41]
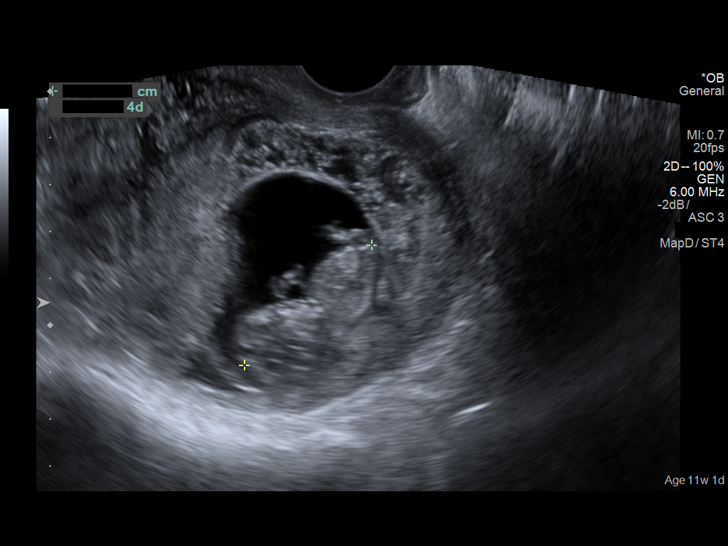
[im 30/41]
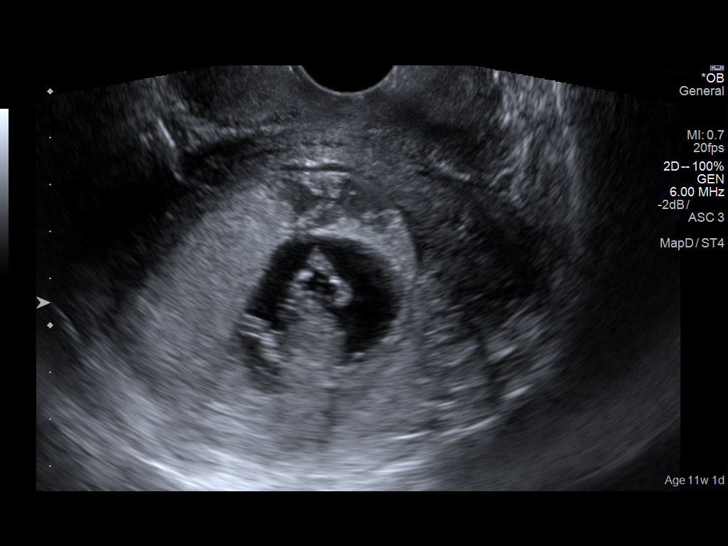
[im 33/41]
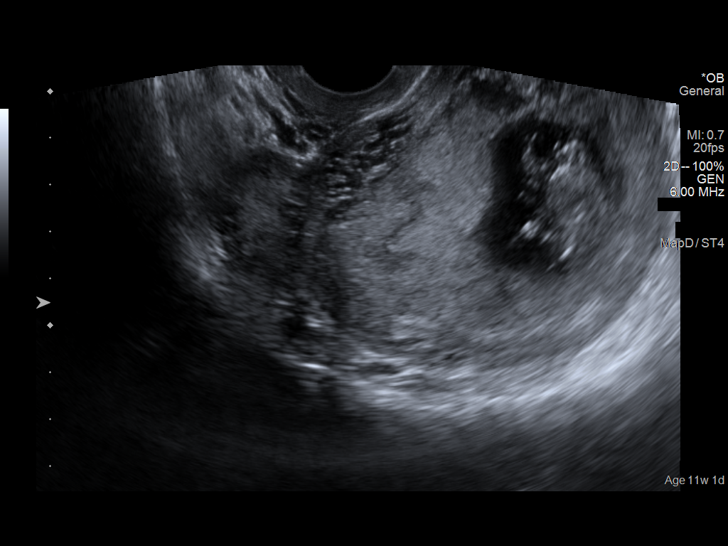
[im 36/41]
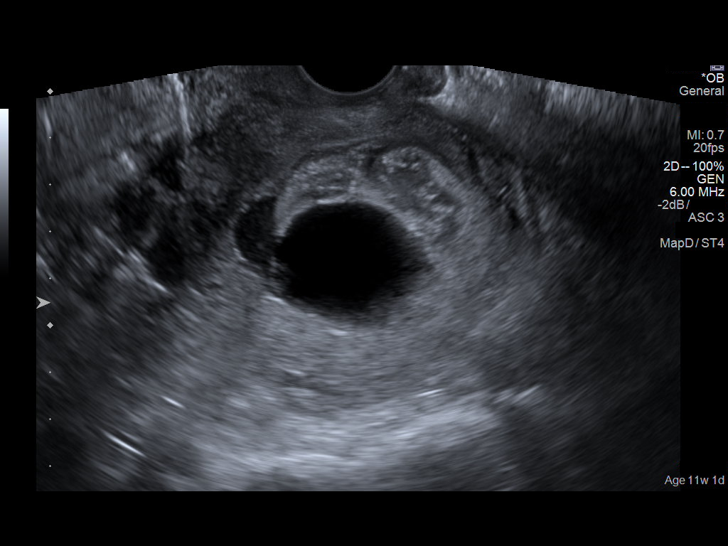
[im 39/41]
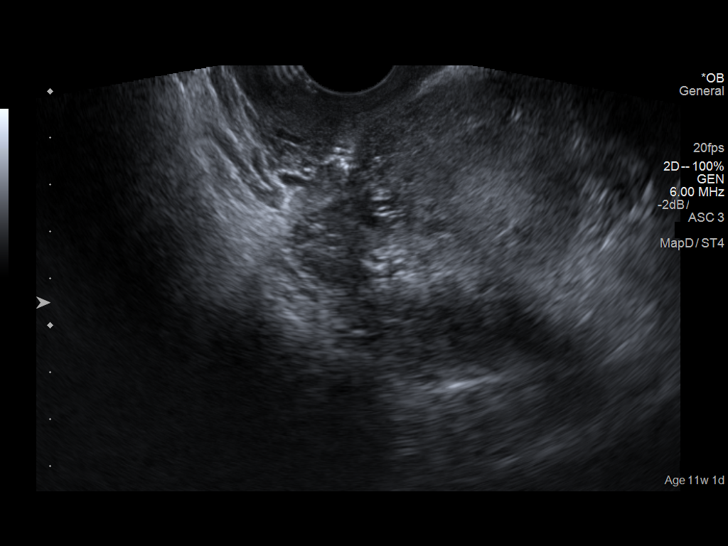

[13 of 28 positions shown; findings below may reference images not displayed]

FINDINGS: Intrauterine gestational sac: Single

Yolk sac:  Visualized.

Embryo:  Visualized.

Cardiac Activity: Visualized.

Heart Rate: 155  bpm

CRL:  37.4  mm   10 w   4 d                  US EDC: 03/16/2017

Subchorionic hemorrhage:  Small subchorionic hemorrhage.

Placenta: There heterogeneous hypoechoic foci within the anterior
aspect of the placenta without mass effect, probably representing
placental venous lakes.

Maternal uterus/adnexae: The right ovary measures 3.4 x 1.8 x
cm. The left ovary measures 2.8 x 2.2 x 2.1 cm. No ovarian mass
identified. Normal appearance of the uterus.
IMPRESSION: 1. Single live intrauterine pregnancy with estimated gestational age
of 10 weeks and 4 days.
2. Small subchorionic hemorrhage.
3. Heterogeneous hypoechoic foci within anterior aspect of placenta
without mass effect, probably representing placental venous lakes.

By: Sindiwe Klopper M.D.

## 2018-02-23 ENCOUNTER — Ambulatory Visit (INDEPENDENT_AMBULATORY_CARE_PROVIDER_SITE_OTHER): Payer: Medicaid Other

## 2018-02-23 DIAGNOSIS — Z3042 Encounter for surveillance of injectable contraceptive: Secondary | ICD-10-CM

## 2018-02-23 DIAGNOSIS — Z3202 Encounter for pregnancy test, result negative: Secondary | ICD-10-CM

## 2018-02-23 LAB — POCT URINE PREGNANCY: Preg Test, Ur: NEGATIVE

## 2018-02-23 MED ORDER — MEDROXYPROGESTERONE ACETATE 150 MG/ML IM SUSY
150.0000 mg | PREFILLED_SYRINGE | Freq: Once | INTRAMUSCULAR | Status: AC
  Administered 2018-02-23: 150 mg via INTRAMUSCULAR

## 2018-02-23 NOTE — Progress Notes (Signed)
   Patient in to nurse clinic not within dates for Depo-Provera injection.  Urine pregnancy test negative. Patient denies unprotected intercourse. Injection given RUOQ. Next injection due 05/12/18-05/26/18.  Reminder card given.  Ples Specter, RN [facility] RN)

## 2018-06-16 ENCOUNTER — Ambulatory Visit (INDEPENDENT_AMBULATORY_CARE_PROVIDER_SITE_OTHER): Payer: Medicaid Other | Admitting: *Deleted

## 2018-06-16 ENCOUNTER — Other Ambulatory Visit: Payer: Self-pay

## 2018-06-16 DIAGNOSIS — Z30013 Encounter for initial prescription of injectable contraceptive: Secondary | ICD-10-CM | POA: Diagnosis not present

## 2018-06-16 LAB — POCT URINE PREGNANCY: Preg Test, Ur: NEGATIVE

## 2018-06-16 MED ORDER — MEDROXYPROGESTERONE ACETATE 150 MG/ML IM SUSY
150.0000 mg | PREFILLED_SYRINGE | Freq: Once | INTRAMUSCULAR | Status: AC
  Administered 2018-06-16: 10:00:00 150 mg via INTRAMUSCULAR

## 2018-06-16 NOTE — Progress Notes (Signed)
Patient here today for Depo Provera injection and is within her dates.    Last contraceptive appt was 02/23/2018.  Patient is past her dates and urine pregnancy performed and was negative. Patient advised to use back up contraception for 7-10 days.    Depo given in RUOQ today.  Site unremarkable & patient tolerated injection.    Next injection due August 4-august 18.  Reminder card given.    Jazmin Hartsell, CMA

## 2018-09-14 ENCOUNTER — Ambulatory Visit (INDEPENDENT_AMBULATORY_CARE_PROVIDER_SITE_OTHER): Payer: Medicaid Other

## 2018-09-14 ENCOUNTER — Other Ambulatory Visit: Payer: Self-pay

## 2018-09-14 DIAGNOSIS — Z30019 Encounter for initial prescription of contraceptives, unspecified: Secondary | ICD-10-CM | POA: Diagnosis not present

## 2018-09-14 MED ORDER — MEDROXYPROGESTERONE ACETATE 150 MG/ML IM SUSP
150.0000 mg | Freq: Once | INTRAMUSCULAR | Status: AC
  Administered 2018-09-14: 150 mg via INTRAMUSCULAR

## 2018-09-14 NOTE — Progress Notes (Signed)
Patient presents in nurse clinic for depo injection. Injection given LUOQ, site unremarkable. Next depo due, 11/2-11/16, reminder card given.

## 2018-12-10 ENCOUNTER — Encounter: Payer: Self-pay | Admitting: Family Medicine

## 2018-12-10 ENCOUNTER — Telehealth: Payer: Self-pay | Admitting: *Deleted

## 2018-12-10 NOTE — Telephone Encounter (Signed)
Pt due for Depo but has not had a visit with provider in over one year.   Spoke with Dr. Ardelia Mems, ok it have one more RN visit for depo but must schedule a keep appt with PCP for further injections.  LMOVM for pt to call back. Christen Bame, CMA

## 2018-12-16 ENCOUNTER — Ambulatory Visit (INDEPENDENT_AMBULATORY_CARE_PROVIDER_SITE_OTHER): Payer: Medicaid Other

## 2018-12-16 ENCOUNTER — Other Ambulatory Visit: Payer: Self-pay

## 2018-12-16 DIAGNOSIS — Z30019 Encounter for initial prescription of contraceptives, unspecified: Secondary | ICD-10-CM | POA: Diagnosis present

## 2018-12-16 LAB — POCT URINE PREGNANCY: Preg Test, Ur: NEGATIVE

## 2018-12-16 MED ORDER — MEDROXYPROGESTERONE ACETATE 150 MG/ML IM SUSP
150.0000 mg | Freq: Once | INTRAMUSCULAR | Status: AC
Start: 2018-12-16 — End: 2018-12-16
  Administered 2018-12-16: 150 mg via INTRAMUSCULAR

## 2018-12-16 NOTE — Progress Notes (Signed)
Patient presents in nurse clinic for depo provera injection. Patient is not within dates. Urine pregnancy test obtained, negative. Patient informed to return in 1 week for nurse visit for repeat urine pregnancy, per clinic protocol. Depo given RUOQ, site unremarkable. Next injection due, 03/03/2019-03/17/2019, reminder card given.    Patient also scheduled for an apt with PCP for well woman exam and birth control counseling on 12/8. Patient informed if she misses this apt she will no longer be able to get depo.

## 2019-01-05 ENCOUNTER — Encounter: Payer: Self-pay | Admitting: Family Medicine

## 2019-01-05 ENCOUNTER — Other Ambulatory Visit (HOSPITAL_COMMUNITY)
Admission: RE | Admit: 2019-01-05 | Discharge: 2019-01-05 | Disposition: A | Discharge status: Home / Self Care | Payer: Medicaid Other | Source: Ambulatory Visit | Attending: Family Medicine | Admitting: Family Medicine

## 2019-01-05 ENCOUNTER — Other Ambulatory Visit: Payer: Self-pay

## 2019-01-05 ENCOUNTER — Ambulatory Visit (INDEPENDENT_AMBULATORY_CARE_PROVIDER_SITE_OTHER): Payer: Medicaid Other | Admitting: Family Medicine

## 2019-01-05 VITALS — BP 98/64 | HR 80 | Wt 125.2 lb

## 2019-01-05 DIAGNOSIS — Z124 Encounter for screening for malignant neoplasm of cervix: Secondary | ICD-10-CM | POA: Diagnosis not present

## 2019-01-05 DIAGNOSIS — Z0000 Encounter for general adult medical examination without abnormal findings: Secondary | ICD-10-CM | POA: Diagnosis not present

## 2019-01-05 DIAGNOSIS — Z13220 Encounter for screening for lipoid disorders: Secondary | ICD-10-CM

## 2019-01-05 DIAGNOSIS — Z113 Encounter for screening for infections with a predominantly sexual mode of transmission: Secondary | ICD-10-CM | POA: Diagnosis not present

## 2019-01-05 DIAGNOSIS — Z3042 Encounter for surveillance of injectable contraceptive: Secondary | ICD-10-CM

## 2019-01-05 NOTE — Patient Instructions (Addendum)
It was great to see you again today!  Checking labs today - will call you with results, or send letter Follow up in 1 year for next physical  Be well, Dr. Ardelia Mems    Health Maintenance, Female Adopting a healthy lifestyle and getting preventive care are important in promoting health and wellness. Ask your health care provider about:  The right schedule for you to have regular tests and exams.  Things you can do on your own to prevent diseases and keep yourself healthy. What should I know about diet, weight, and exercise? Eat a healthy diet   Eat a diet that includes plenty of vegetables, fruits, low-fat dairy products, and lean protein.  Do not eat a lot of foods that are high in solid fats, added sugars, or sodium. Maintain a healthy weight Body mass index (BMI) is used to identify weight problems. It estimates body fat based on height and weight. Your health care provider can help determine your BMI and help you achieve or maintain a healthy weight. Get regular exercise Get regular exercise. This is one of the most important things you can do for your health. Most adults should:  Exercise for at least 150 minutes each week. The exercise should increase your heart rate and make you sweat (moderate-intensity exercise).  Do strengthening exercises at least twice a week. This is in addition to the moderate-intensity exercise.  Spend less time sitting. Even light physical activity can be beneficial. Watch cholesterol and blood lipids Have your blood tested for lipids and cholesterol at 33 years of age, then have this test every 5 years. Have your cholesterol levels checked more often if:  Your lipid or cholesterol levels are high.  You are older than 33 years of age.  You are at high risk for heart disease. What should I know about cancer screening? Depending on your health history and family history, you may need to have cancer screening at various ages. This may include  screening for:  Breast cancer.  Cervical cancer.  Colorectal cancer.  Skin cancer.  Lung cancer. What should I know about heart disease, diabetes, and high blood pressure? Blood pressure and heart disease  High blood pressure causes heart disease and increases the risk of stroke. This is more likely to develop in people who have high blood pressure readings, are of African descent, or are overweight.  Have your blood pressure checked: ? Every 3-5 years if you are 56-46 years of age. ? Every year if you are 56 years old or older. Diabetes Have regular diabetes screenings. This checks your fasting blood sugar level. Have the screening done:  Once every three years after age 73 if you are at a normal weight and have a low risk for diabetes.  More often and at a younger age if you are overweight or have a high risk for diabetes. What should I know about preventing infection? Hepatitis B If you have a higher risk for hepatitis B, you should be screened for this virus. Talk with your health care provider to find out if you are at risk for hepatitis B infection. Hepatitis C Testing is recommended for:  Everyone born from [birth year] through 1965.  Anyone with known risk factors for hepatitis C. Sexually transmitted infections (STIs)  Get screened for STIs, including gonorrhea and chlamydia, if: ? You are sexually active and are younger than 33 years of age. ? You are older than 33 years of age and your health care provider tells you  that you are at risk for this type of infection. ? Your sexual activity has changed since you were last screened, and you are at increased risk for chlamydia or gonorrhea. Ask your health care provider if you are at risk.  Ask your health care provider about whether you are at high risk for HIV. Your health care provider may recommend a prescription medicine to help prevent HIV infection. If you choose to take medicine to prevent HIV, you should first get  tested for HIV. You should then be tested every 3 months for as long as you are taking the medicine. Pregnancy  If you are about to stop having your period (premenopausal) and you may become pregnant, seek counseling before you get pregnant.  Take 400 to 800 micrograms (mcg) of folic acid every day if you become pregnant.  Ask for birth control (contraception) if you want to prevent pregnancy. Osteoporosis and menopause Osteoporosis is a disease in which the bones lose minerals and strength with aging. This can result in bone fractures. If you are 9 years old or older, or if you are at risk for osteoporosis and fractures, ask your health care provider if you should:  Be screened for bone loss.  Take a calcium or vitamin D supplement to lower your risk of fractures.  Be given hormone replacement therapy (HRT) to treat symptoms of menopause. Follow these instructions at home: Lifestyle  Do not use any products that contain nicotine or tobacco, such as cigarettes, e-cigarettes, and chewing tobacco. If you need help quitting, ask your health care provider.  Do not use street drugs.  Do not share needles.  Ask your health care provider for help if you need support or information about quitting drugs. Alcohol use  Do not drink alcohol if: ? Your health care provider tells you not to drink. ? You are pregnant, may be pregnant, or are planning to become pregnant.  If you drink alcohol: ? Limit how much you use to 0-1 drink a day. ? Limit intake if you are breastfeeding.  Be aware of how much alcohol is in your drink. In the [place] one drink equals one 12 oz bottle of beer (355 mL), one 5 oz glass of wine (148 mL), or one 1 oz glass of hard liquor (44 mL). General instructions  Schedule regular health, dental, and eye exams.  Stay current with your vaccines.  Tell your health care provider if: ? You often feel depressed. ? You have ever been abused or do not feel safe at home.  Summary  Adopting a healthy lifestyle and getting preventive care are important in promoting health and wellness.  Follow your health care provider's instructions about healthy diet, exercising, and getting tested or screened for diseases.  Follow your health care provider's instructions on monitoring your cholesterol and blood pressure. This information is not intended to replace advice given to you by your health care provider. Make sure you discuss any questions you have with your health care provider. Document Released: 07/30/2010 Document Revised: 01/07/2018 Document Reviewed: 01/07/2018 Elsevier Patient Education  2020 [employer].

## 2019-01-05 NOTE — Progress Notes (Signed)
Date of Visit: 01/05/2019   HPI:  Meghan Frederick is a 33 year old female with no significant previous medical history presenting for an annual physical.   Concerns today: trouble losing baby weight, friend thought it could be fibroids Contraception: on depo, likes this Pelvic symptoms: no vag discharge or pain Sexual activity: not any recent sexual activity STD Screening: agreeable to this today Pap smear status: due today Exercise: no regular exercise Smoking: none Alcohol: occasional Drugs: none currently Mood: doing fine Dentist: needs medicaid dental list  ROS: See HPI.  [place]: none  PHYSICAL EXAM: BP 98/64   Pulse 80   Wt 125 lb 3.2 oz (56.8 kg)   SpO2 99%   BMI 21.49 kg/m  Gen: Well appearing, sitting upright in bed, conversing appropriately  HEENT: normocephalic, PERRL, no thryomegaly Heart: RRR, normal s1, s2, no m/r/g Lungs: clear to auscultation bilaterally, no wheezes or crackles  Abdomen: soft, nontender to palpation, no masses GU: normal appearing external genitalia without lesions. Vagina is moist and normally ruggated. Cervix normal in appearance. No cervical motion tenderness or tenderness on bimanual exam. No adnexal masses.  Neuro: EOM intact, alert and oriented x 3, speech normal, grossly nonfocal  ASSESSMENT/PLAN:  Health maintenance:  - influenza vaccination offered, patient declined - pap smear done today - STD screening: obtained HIV, RPR, gc/chl/trich today with pap - hyperlipidemia screening: check CMET & lipids, patient is fasting today  Weight Gain: encouraged exercise & healthy eating, normal pelvic & abdominal exam  Contraception management Doing well on depo, continue   FOLLOW UP: Follow up in 1 year for next [place]  Patient seen along with [employer] student Maudry Diego. I personally evaluated this patient along with the student, and verified all aspects of the history, physical exam, and  medical decision making as documented by the student. I agree with the student's documentation and have made all necessary edits.  Chrisandra Netters, MD [place]

## 2019-01-06 LAB — LIPID PANEL
Chol/HDL Ratio: 2.2 ratio (ref 0.0–4.4)
Cholesterol, Total: 125 mg/dL (ref 100–199)
HDL: 56 mg/dL (ref >39)
LDL Chol Calc (NIH): 55 mg/dL (ref 0–99)
Triglycerides: 65 mg/dL (ref 0–149)
VLDL Cholesterol Cal: 14 mg/dL (ref 5–40)

## 2019-01-06 LAB — CMP14+EGFR
ALT: 12 IU/L (ref 0–32)
AST: 15 IU/L (ref 0–40)
Albumin/Globulin Ratio: 1.6 (ref 1.2–2.2)
Albumin: 4.6 g/dL (ref 3.8–4.8)
Alkaline Phosphatase: 79 IU/L (ref 39–117)
BUN/Creatinine Ratio: 13 (ref 9–23)
BUN: 10 mg/dL (ref 6–20)
Bilirubin Total: 0.5 mg/dL (ref 0.0–1.2)
CO2: 19 mmol/L — ABNORMAL LOW (ref 20–29)
Calcium: 9.4 mg/dL (ref 8.7–10.2)
Chloride: 105 mmol/L (ref 96–106)
Creatinine, Ser: 0.76 mg/dL (ref 0.57–1.00)
GFR calc Af Amer: 119 mL/min/{1.73_m2} (ref >59)
GFR calc non Af Amer: 103 mL/min/{1.73_m2} (ref >59)
Globulin, Total: 2.8 g/dL (ref 1.5–4.5)
Glucose: 83 mg/dL (ref 65–99)
Potassium: 3.7 mmol/L (ref 3.5–5.2)
Sodium: 141 mmol/L (ref 134–144)
Total Protein: 7.4 g/dL (ref 6.0–8.5)

## 2019-01-06 LAB — HIV ANTIBODY (ROUTINE TESTING W REFLEX): HIV Screen 4th Generation wRfx: NONREACTIVE

## 2019-01-06 LAB — RPR: RPR Ser Ql: NONREACTIVE

## 2019-01-07 LAB — CYTOLOGY - PAP
Chlamydia: NEGATIVE
Comment: NEGATIVE
Comment: NEGATIVE
Comment: NEGATIVE
Comment: NORMAL
Diagnosis: NEGATIVE
High risk HPV: NEGATIVE
Neisseria Gonorrhea: NEGATIVE
Trichomonas: NEGATIVE

## 2019-01-11 DIAGNOSIS — Z309 Encounter for contraceptive management, unspecified: Secondary | ICD-10-CM | POA: Insufficient documentation

## 2019-01-11 NOTE — Assessment & Plan Note (Signed)
Doing well on depo, continue 

## 2019-02-08 ENCOUNTER — Telehealth: Payer: Self-pay | Admitting: Family Medicine

## 2019-02-08 NOTE — Telephone Encounter (Signed)
Pt is calling and would like to know if Dr. Pollie Meyer could call her to discuss issues she is having since having her last depo shot. She is having cramping and bleeding through before she is due for her next shot.   The best call back number is [phone]

## 2019-02-08 NOTE — Telephone Encounter (Signed)
Pt also calls nurse line to discuss.  She endorses early periods and cramping for last few doses of depo.  She contributes this to the fact that we do not give it in the arm anymore, reassured her that this should not be contributing to the issue.  Pt is unable to give me exact dates but states that she started spotting around the time of her appt with Dr. Pollie Meyer which can last 1 day - 4 days.  She endorses more frequent cramping.   Pt preferred not to make an appt.  To PCP to advise.  Jone Baseman, CMA

## 2019-02-10 NOTE — Telephone Encounter (Signed)
Called patient. Unclear whether location of administration would interfere with body's reaction to depo. RN team, is it an option for her to get her shot in her arm moving forward since she preferred it that way?  Thanks Latrelle Dodrill, MD

## 2019-02-12 NOTE — Telephone Encounter (Signed)
RN team can you let patient know that we can give her the shot in her arm. Let's start there and see if it helps. Latrelle Dodrill, MD

## 2019-02-15 NOTE — Telephone Encounter (Signed)
Sounds good

## 2019-03-02 ENCOUNTER — Telehealth: Payer: Self-pay | Admitting: *Deleted

## 2019-03-02 NOTE — Telephone Encounter (Signed)
LVM to call office to go over screening questions prior to visit tomorrow.Meghan Frederick, CMA  

## 2019-03-03 ENCOUNTER — Other Ambulatory Visit: Payer: Self-pay

## 2019-03-03 ENCOUNTER — Ambulatory Visit (INDEPENDENT_AMBULATORY_CARE_PROVIDER_SITE_OTHER): Payer: Medicaid Other

## 2019-03-03 DIAGNOSIS — Z3042 Encounter for surveillance of injectable contraceptive: Secondary | ICD-10-CM | POA: Diagnosis not present

## 2019-03-04 MED ORDER — MEDROXYPROGESTERONE ACETATE 150 MG/ML IM SUSP
150.0000 mg | Freq: Once | INTRAMUSCULAR | Status: AC
  Administered 2019-03-03: 150 mg via INTRAMUSCULAR

## 2019-03-04 NOTE — Progress Notes (Signed)
Patients presents in nurse clinic for depo provera injection. Patient is within dates and requests depo injection in deltoid. Injection given LD, site unremarkable. Patient due back for next injection, 05/19/2019-06/02/2019, reminder card given.

## 2019-05-19 ENCOUNTER — Ambulatory Visit: Payer: Medicaid Other

## 2019-05-20 ENCOUNTER — Ambulatory Visit (INDEPENDENT_AMBULATORY_CARE_PROVIDER_SITE_OTHER): Payer: Medicaid Other

## 2019-05-20 ENCOUNTER — Other Ambulatory Visit: Payer: Self-pay

## 2019-05-20 DIAGNOSIS — Z3042 Encounter for surveillance of injectable contraceptive: Secondary | ICD-10-CM

## 2019-05-20 MED ORDER — MEDROXYPROGESTERONE ACETATE 150 MG/ML IM SUSP
150.0000 mg | Freq: Once | INTRAMUSCULAR | Status: AC
  Administered 2019-05-20: 150 mg via INTRAMUSCULAR

## 2019-05-20 NOTE — Progress Notes (Signed)
Patient here today for Depo Provera injection and is within her dates.    Depo given in RD today, per patients request. Site unremarkable & patient tolerated injection.    Next injection due 08/05/2019-08/19/2019. Reminder card given.

## 2019-06-29 ENCOUNTER — Ambulatory Visit: Payer: Medicaid Other | Admitting: Family Medicine

## 2019-08-11 ENCOUNTER — Other Ambulatory Visit: Payer: Self-pay

## 2019-08-11 ENCOUNTER — Ambulatory Visit (INDEPENDENT_AMBULATORY_CARE_PROVIDER_SITE_OTHER): Payer: Medicaid Other

## 2019-08-11 DIAGNOSIS — Z3042 Encounter for surveillance of injectable contraceptive: Secondary | ICD-10-CM

## 2019-08-11 MED ORDER — MEDROXYPROGESTERONE ACETATE 150 MG/ML IM SUSP
150.0000 mg | INTRAMUSCULAR | Status: AC
  Administered 2019-08-11: 15:00:00 150 mg via INTRAMUSCULAR

## 2019-08-11 NOTE — Progress Notes (Signed)
Patient here today for Depo Provera injection and is within her dates.    Last contraceptive appt was 12/2018  Depo given in [facility] today.  Site unremarkable & patient tolerated injection.    Next injection due 9/29-10/13.  Reminder card given.    Veronda Prude, RN

## 2019-08-20 ENCOUNTER — Ambulatory Visit (INDEPENDENT_AMBULATORY_CARE_PROVIDER_SITE_OTHER): Payer: Medicaid Other | Admitting: Family Medicine

## 2019-08-20 ENCOUNTER — Encounter: Payer: Self-pay | Admitting: Family Medicine

## 2019-08-20 ENCOUNTER — Other Ambulatory Visit: Payer: Self-pay

## 2019-08-20 VITALS — BP 116/62 | HR 84 | Wt 125.0 lb

## 2019-08-20 DIAGNOSIS — N898 Other specified noninflammatory disorders of vagina: Secondary | ICD-10-CM | POA: Insufficient documentation

## 2019-08-20 DIAGNOSIS — E663 Overweight: Secondary | ICD-10-CM | POA: Insufficient documentation

## 2019-08-20 NOTE — Patient Instructions (Signed)
It was a pleasure to meet you today.  Regarding your vaginal concerns I am going to send you to a gynecologist where they should be able to remove that excess piece of tissue.  It looks like normal vaginal tissue which is reassuring but I can see where it may cause discomfort and concern.  Regarding your abdomen I do not see any signs concerning for hernias.  You do not have a significant amount of excess skin and so recommend continued core strengthening exercises.  If you have any questions or concerns please feel free to call our clinic.  Happy have a wonderful afternoon! Core Strength Exercises  Core exercises help to build strength in the muscles between your ribs and your hips (abdominal muscles). These muscles help to support your body and keep your spine stable. It is important to maintain strength in your core to prevent injury and pain. Some activities, such as yoga and Pilates, can help to strengthen core muscles. You can also strengthen core muscles with exercises at home. It is important to talk to your health care provider before you start a new exercise routine. What are the benefits of core strength exercises? Core strength exercises can:  Reduce back pain.  Help to rebuild strength after a back or spine injury.  Help to prevent injury during physical activity, especially injuries to the back and knees. How to do core strength exercises Repeat these exercises 10-15 times, or until you are tired. Do exercises exactly as told by your health care provider and adjust them as directed. It is normal to feel mild stretching, pulling, tightness, or discomfort as you do these exercises. If you feel any pain while doing these exercises, stop. If your pain continues or gets worse when doing core exercises, contact your health care provider. You may want to use a padded yoga or exercise mat for strength exercises that are done on the floor. Bridging  1. Lie on your back on a firm surface with  your knees bent and your feet flat on the floor. 2. Raise your hips so that your knees, hips, and shoulders form a straight line together. Keep your abdominal muscles tight. 3. Hold this position for 3-5 seconds. 4. Slowly lower your hips to the starting position. 5. Let your muscles relax completely between repetitions. Single-leg bridge 1. Lie on your back on a firm surface with your knees bent and your feet flat on the floor. 2. Raise your hips so that your knees, hips, and shoulders form a straight line together. Keep your abdominal muscles tight. 3. Lift one foot off the floor, then completely straighten that leg. 4. Hold this position for 3-5 seconds. 5. Put the straight leg back down in the bent position. 6. Slowly lower your hips to the starting position. 7. Repeat these steps using your other leg. Side bridge 1. Lie on your side with your knees bent. Prop yourself up on the elbow that is near the floor. 2. Using your abdominal muscles and your elbow that is on the floor, raise your body off the floor. Raise your hip so that your shoulder, hip, and foot form a straight line together. 3. Hold this position for 10 seconds. Keep your head and neck raised and away from your shoulder (in their normal, neutral position). Keep your abdominal muscles tight. 4. Slowly lower your hip to the starting position. 5. Repeat and try to hold this position longer, working your way up to 30 seconds. Abdominal crunch 1. Lorenz Coaster  on your back on a firm surface. Bend your knees and keep your feet flat on the floor. 2. Cross your arms over your chest. 3. Without bending your neck, tip your chin slightly toward your chest. 4. Tighten your abdominal muscles as you lift your chest just high enough to lift your shoulder blades off of the floor. Do not hold your breath. You can do this with short lifts or long lifts. 5. Slowly return to the starting position. Bird dog 1. Get on your hands and knees, with your legs  shoulder-width apart and your arms under your shoulders. Keep your back straight. 2. Tighten your abdominal muscles. 3. Raise one of your legs off the floor and straighten it. Try to keep it parallel to the floor. 4. Slowly lower your leg to the starting position. 5. Raise one of your arms off the floor and straighten it. Try to keep it parallel to the floor. 6. Slowly lower your arm to the starting position. 7. Repeat with the other arm and leg. If possible, try raising a leg and arm at the same time, on opposite sides of the body. For example, raise your left hand and your right leg. Plank 1. Lie on your belly. 2. Prop up your body onto your forearms and your feet, keeping your legs straight. Your body should make a straight line between your shoulders and feet. 3. Hold this position for 10 seconds while keeping your abdominal muscles tight. 4. Lower your body to the starting position. 5. Repeat and try to hold this position longer, working your way up to 30 seconds. Cross-core strengthening 1. Stand with your feet shoulder-width apart. 2. Hold a ball out in front of you. Keep your arms straight. 3. Tighten your abdominal muscles and slowly rotate at your waist from side to side. Keep your feet flat. 4. Once you are comfortable, try repeating this exercise with a heavier ball. Top core strengthening 1. Stand about 18 inches (46 cm) in front of a wall, with your back to the wall. 2. Keep your feet flat and shoulder-width apart. 3. Tighten your abdominal muscles. 4. Bend your hips and knees. 5. Slowly reach between your legs to touch the wall behind you. 6. Slowly stand back up. 7. Raise your arms over your head and reach behind you. 8. Return to the starting position. General tips  Do not do any exercises that cause pain. If you have pain while exercising, talk to your health care provider.  Always stretch before and after doing these exercises. This can help prevent  injury.  Maintain a healthy weight. Ask your health care provider what weight is healthy for you. Contact a health care provider if:  You have back pain that gets worse or does not go away.  You feel pain while doing core strength exercises. Get help right away if:  You have severe pain that does not get better with medicine. Summary  Core exercises help to build strength in the muscles between your ribs and your waist.  Core muscles help to support your body and keep your spine stable.  Some activities, such as yoga and Pilates, can help to strengthen core muscles.  Core strength exercises can help back pain and can prevent injury.  If you feel any pain while doing core strength exercises, stop. This information is not intended to replace advice given to you by your health care provider. Make sure you discuss any questions you have with your health care provider. Document  Revised: 05/06/2018 Document Reviewed: 06/05/2016 Elsevier Patient Education  2020 [employer].

## 2019-08-20 NOTE — Assessment & Plan Note (Signed)
Patient reports that she had tearing during her delivery of her child 2 years ago.  She has noticed excess tissue right inside her vaginal introitus which is bothersome and occasionally sticks out.  Tissue visualizes approximately 1 cm long and appears to be normal vaginal tissue, may have some calcifications present.  It is at the 4 o'clock position just inside the introitus -Discussed options with patient and patient is requesting referral for gynecology for removal of tissue -Ambulatory referral to gynecology placed

## 2019-08-20 NOTE — Progress Notes (Signed)
° ° °  SUBJECTIVE:   CHIEF COMPLAINT / HPI:   Vaginal concerns Patient reports that she has had a piece of extra tissue in her vagina since the delivery of her child 2 years ago.  It sometimes becomes hard and sticks through her vagina and is extremely bothersome.  She would like it removed if possible.  Abdominal concerns  Patient reports that since the birth of her child she has had excess abdominal fat and she is concerned that she may have excess skin.  She was told by a friend that she should be evaluated for removal of the skin.  She reports she did recently do a 30-day weight loss challenge and although she does not feel like she has lost weight she has lost weight.  Denies any abnormal protrusions from her abdomen. OBJECTIVE:   BP (!) 116/62    Pulse 84    Wt 125 lb (56.7 kg)    SpO2 99%    BMI 21.46 kg/m   General: Well-appearing, no acute distress Respiratory: Normal work of breathing Abdomen: Soft, nontender, no excess skin noted, no skin issues noted, no identifiable hernia while laying or while standing.  Patient does have excess adipose tissue which she reports has been present since delivery of her child 2 years ago. GU: Patient has approximately 1 cm long flesh-colored vaginal tissue protrusion at the 4 o'clock position just behind the introitus.  Labia appear within normal limits, protrusion appears to be normal vaginal tissue.  May have some calcifications in the protrusion.  ASSESSMENT/PLAN:   Vaginal lesion Patient reports that she had tearing during her delivery of her child 2 years ago.  She has noticed excess tissue right inside her vaginal introitus which is bothersome and occasionally sticks out.  Tissue visualizes approximately 1 cm long and appears to be normal vaginal tissue, may have some calcifications present.  It is at the 4 o'clock position just inside the introitus -Discussed options with patient and patient is requesting referral for gynecology for removal of  tissue -Ambulatory referral to gynecology placed  Excess weight Patient reports that since her pregnancy 2 years ago she has had difficulty losing her excess adipose tissue in her stomach.  She was told by a friend that she may have extra skin which could be removed.  She is here for evaluation for this concern.  Patient denies any protrusions through her abdominal wall.  No palpable hernias noted.  No excess skin post pregnancy noted -Patient given information regarding core exercises to help to in the abdomen -Discussed signs and symptoms of hernia for patient to watch out for.   Derrel Nip, MD [facility]

## 2019-08-20 NOTE — Assessment & Plan Note (Addendum)
Patient reports that since her pregnancy 2 years ago she has had difficulty losing her excess adipose tissue in her stomach.  She was told by a friend that she may have extra skin which could be removed.  She is here for evaluation for this concern.  Patient denies any protrusions through her abdominal wall.  No palpable hernias noted.  No excess skin post pregnancy noted -Patient given information regarding core exercises to help to in the abdomen -Discussed signs and symptoms of hernia for patient to watch out for.

## 2019-11-05 ENCOUNTER — Ambulatory Visit (INDEPENDENT_AMBULATORY_CARE_PROVIDER_SITE_OTHER): Payer: Medicaid Other | Admitting: Medical

## 2019-11-05 ENCOUNTER — Other Ambulatory Visit: Payer: Self-pay

## 2019-11-05 ENCOUNTER — Encounter: Payer: Self-pay | Admitting: Medical

## 2019-11-05 VITALS — BP 123/87 | HR 68 | Wt 122.9 lb

## 2019-11-05 DIAGNOSIS — Z3042 Encounter for surveillance of injectable contraceptive: Secondary | ICD-10-CM

## 2019-11-05 DIAGNOSIS — N898 Other specified noninflammatory disorders of vagina: Secondary | ICD-10-CM | POA: Diagnosis not present

## 2019-11-05 MED ORDER — MEDROXYPROGESTERONE ACETATE 150 MG/ML IM SUSP
150.0000 mg | INTRAMUSCULAR | Status: AC
  Administered 2019-11-05: 150 mg via INTRAMUSCULAR

## 2019-11-05 NOTE — Patient Instructions (Signed)
Medroxyprogesterone injection [Contraceptive] What is this medicine? MEDROXYPROGESTERONE (me DROX ee proe JES te rone) contraceptive injections prevent pregnancy. They provide effective birth control for 3 months. Depo-subQ Provera 104 is also used for treating pain related to endometriosis. This medicine may be used for other purposes; ask your health care provider or pharmacist if you have questions. COMMON BRAND NAME(S): Depo-Provera, Depo-subQ Provera 104 What should I tell my health care provider before I take this medicine? They need to know if you have any of these conditions:  frequently drink alcohol  asthma  blood vessel disease or a history of a blood clot in the lungs or legs  bone disease such as osteoporosis  breast cancer  diabetes  eating disorder (anorexia nervosa or bulimia)  high blood pressure  HIV infection or AIDS  kidney disease  liver disease  mental depression  migraine  seizures (convulsions)  stroke  tobacco smoker  vaginal bleeding  an unusual or allergic reaction to medroxyprogesterone, other hormones, medicines, foods, dyes, or preservatives  pregnant or trying to get pregnant  breast-feeding How should I use this medicine? Depo-Provera Contraceptive injection is given into a muscle. Depo-subQ Provera 104 injection is given under the skin. These injections are given by a health care professional. You must not be pregnant before getting an injection. The injection is usually given during the first 5 days after the start of a menstrual period or 6 weeks after delivery of a baby. Talk to your pediatrician regarding the use of this medicine in children. Special care may be needed. These injections have been used in female children who have started having menstrual periods. Overdosage: If you think you have taken too much of this medicine contact a poison control center or emergency room at once. NOTE: This medicine is only for you. Do not  share this medicine with others. What if I miss a dose? Try not to miss a dose. You must get an injection once every 3 months to maintain birth control. If you cannot keep an appointment, call and reschedule it. If you wait longer than 13 weeks between Depo-Provera contraceptive injections or longer than 14 weeks between Depo-subQ Provera 104 injections, you could get pregnant. Use another method for birth control if you miss your appointment. You may also need a pregnancy test before receiving another injection. What may interact with this medicine? Do not take this medicine with any of the following medications:  bosentan This medicine may also interact with the following medications:  aminoglutethimide  antibiotics or medicines for infections, especially rifampin, rifabutin, rifapentine, and griseofulvin  aprepitant  barbiturate medicines such as phenobarbital or primidone  bexarotene  carbamazepine  medicines for seizures like ethotoin, felbamate, oxcarbazepine, phenytoin, topiramate  modafinil  St. John's wort This list may not describe all possible interactions. Give your health care provider a list of all the medicines, herbs, non-prescription drugs, or dietary supplements you use. Also tell them if you smoke, drink alcohol, or use illegal drugs. Some items may interact with your medicine. What should I watch for while using this medicine? This drug does not protect you against HIV infection (AIDS) or other sexually transmitted diseases. Use of this product may cause you to lose calcium from your bones. Loss of calcium may cause weak bones (osteoporosis). Only use this product for more than 2 years if other forms of birth control are not right for you. The longer you use this product for birth control the more likely you will be at risk   for weak bones. Ask your health care professional how you can keep strong bones. You may have a change in bleeding pattern or irregular periods.  Many females stop having periods while taking this drug. If you have received your injections on time, your chance of being pregnant is very low. If you think you may be pregnant, see your health care professional as soon as possible. Tell your health care professional if you want to get pregnant within the next year. The effect of this medicine may last a long time after you get your last injection. What side effects may I notice from receiving this medicine? Side effects that you should report to your doctor or health care professional as soon as possible:  allergic reactions like skin rash, itching or hives, swelling of the face, lips, or tongue  breast tenderness or discharge  breathing problems  changes in vision  depression  feeling faint or lightheaded, falls  fever  pain in the abdomen, chest, groin, or leg  problems with balance, talking, walking  unusually weak or tired  yellowing of the eyes or skin Side effects that usually do not require medical attention (report to your doctor or health care professional if they continue or are bothersome):  acne  fluid retention and swelling  headache  irregular periods, spotting, or absent periods  temporary pain, itching, or skin reaction at site where injected  weight gain This list may not describe all possible side effects. Call your doctor for medical advice about side effects. You may report side effects to FDA at 1-800-FDA-1088. Where should I keep my medicine? This does not apply. The injection will be given to you by a health care professional. NOTE: This sheet is a summary. It may not cover all possible information. If you have questions about this medicine, talk to your doctor, pharmacist, or health care provider.  2020 Elsevier/Gold Standard (2008-02-05 18:37:56)  

## 2019-11-05 NOTE — Progress Notes (Signed)
  History:  [name] is a 34 y.o. G1P1001 who presents to clinic today for evaluation of a vaginal bump. The patient states that since the birth of her child 2 years ago she has felt like she did not heal normally. About 2 months ago she felt a bump at the introitus region and her PCP said it was normal, but she wanted to have another evaluation. She also states that she has been on Depo provera x 2 year and has continued to note mid-cycle spotting. She is not sexually active.   The following portions of the patient's history were reviewed and updated as appropriate: allergies, current medications, family history, past medical history, social history, past surgical history and problem list.  Review of Systems:  Review of Systems  Constitutional: Negative for fever.  Gastrointestinal: Negative for abdominal pain.  Genitourinary: Negative for dysuria, frequency and urgency.       Neg - vaginal bleeding, discharge      Objective:  Physical Exam BP 123/87   Pulse 68   Wt 122 lb 14.4 oz (55.7 kg)   BMI 21.10 kg/m  Physical Exam Vitals and nursing note reviewed. Exam conducted with a chaperone present.  Constitutional:      General: She is not in acute distress.    Appearance: She is well-developed and normal weight.  HENT:     Head: Normocephalic and atraumatic.  Cardiovascular:     Rate and Rhythm: Normal rate.  Pulmonary:     Effort: Pulmonary effort is normal.  Abdominal:     General: There is no distension.     Palpations: Abdomen is soft.  Genitourinary:    Labia:        Right: No rash, tenderness or lesion.        Left: No rash, tenderness or lesion.     Skin:    General: Skin is warm and dry.     Findings: No erythema.  Neurological:     Mental Status: She is alert and oriented to person, place, and time.     Assessment & Plan:  1. Concern for vaginal lesion - Normal   2. Encounter for surveillance of injectable contraceptive - medroxyPROGESTERone  (DEPO-PROVERA) injection 150 mg - Patient offered to try OCPs to help with breakthrough bleeding, she declines - Patient advised to call if she would like to discuss other birth control options - She will follow-up in 3 months for next depo provera   Approximately 20 minutes of total time was spent with this patient on history taking, physical exam, coordination of care and documentation  Marny Lowenstein, PA-C 11/05/2019 11:24 AM

## 2020-01-24 ENCOUNTER — Ambulatory Visit (INDEPENDENT_AMBULATORY_CARE_PROVIDER_SITE_OTHER): Payer: Medicaid Other | Admitting: *Deleted

## 2020-01-24 ENCOUNTER — Encounter: Payer: Self-pay | Admitting: *Deleted

## 2020-01-24 ENCOUNTER — Other Ambulatory Visit: Payer: Self-pay

## 2020-01-24 VITALS — BP 120/80 | HR 92 | Wt 116.0 lb

## 2020-01-24 DIAGNOSIS — Z3042 Encounter for surveillance of injectable contraceptive: Secondary | ICD-10-CM

## 2020-01-24 MED ORDER — MEDROXYPROGESTERONE ACETATE 150 MG/ML IM SUSP
150.0000 mg | Freq: Once | INTRAMUSCULAR | Status: AC
  Administered 2020-01-24: 15:00:00 150 mg via INTRAMUSCULAR

## 2020-01-24 NOTE — Progress Notes (Signed)
Pt here for Depo injection. Depo 150mg  given without difficulty and pt tol well. Next Depo March 14-April 24 2020

## 2020-02-04 ENCOUNTER — Encounter (HOSPITAL_COMMUNITY): Payer: Self-pay

## 2020-02-04 ENCOUNTER — Other Ambulatory Visit: Payer: Self-pay

## 2020-02-04 ENCOUNTER — Ambulatory Visit (HOSPITAL_COMMUNITY)
Admission: EM | Admit: 2020-02-04 | Discharge: 2020-02-04 | Discharge status: Against Medical Advice | Payer: Medicaid Other

## 2020-02-04 ENCOUNTER — Ambulatory Visit (HOSPITAL_COMMUNITY)
Admission: EM | Admit: 2020-02-04 | Discharge: 2020-02-04 | Disposition: A | Discharge status: Home / Self Care | Payer: Medicaid Other | Attending: Physician Assistant | Admitting: Physician Assistant

## 2020-02-04 ENCOUNTER — Telehealth: Payer: Self-pay | Admitting: Family Medicine

## 2020-02-04 DIAGNOSIS — J069 Acute upper respiratory infection, unspecified: Secondary | ICD-10-CM

## 2020-02-04 DIAGNOSIS — U071 COVID-19: Secondary | ICD-10-CM | POA: Insufficient documentation

## 2020-02-04 NOTE — Telephone Encounter (Signed)
Pt states she received depo 01-24-20 and now she is having severe pain in her hip at same site as injection for 2 days. Pt needs to know if she needs to come in. States that inj usually is at "meaty part" of hip but this time they said it will go in at "boney area". thanks

## 2020-02-04 NOTE — ED Notes (Signed)
Pt called on phone and told to come into lobby, stated she was on her way in

## 2020-02-04 NOTE — Discharge Instructions (Addendum)
Covid is pending  

## 2020-02-04 NOTE — ED Triage Notes (Signed)
Pt in with c.o ST that started after she slept under the fan last night

## 2020-02-04 NOTE — ED Notes (Signed)
Pt called in lobby, no response 

## 2020-02-04 NOTE — Telephone Encounter (Signed)
Called patient regarding earlier phone call. She reports for past few days she has had horrible pain in her hip and back right where the injection was given. She states pain radiates down into her hip and her low back. Patient reports injection was not given in her hip but essentially her low back in a bony area. She states she has been getting depo since she was in high school and this has never happened before. Patient states she has tried tylenol, hot baths and a heating pad but the pain is still there and is severe. Discussed with patient I would have thought pain from the injection would have happened before now rather than this delayed. Advised she switch from tylenol to ibuprofen as it is a NSAID which will work much better for pain relief and decreasing inflammation. Patient verbalized understanding and states she has ibuprofen 800mg  pills. Advised she take that every 8 hours through the weekend and if she wasn't feeling much better on Monday to call for an appt to come in. Patient verbalized understanding.

## 2020-02-04 NOTE — ED Notes (Signed)
Pt called in lobby no response 

## 2020-02-04 NOTE — ED Provider Notes (Signed)
[facility]    CSN: [id number] Arrival date & time: 02/04/20  1734      History   Chief Complaint Chief Complaint  Patient presents with  . Sore Throat    HPI Meghan Frederick is a 35 y.o. female.   Pt complains of a sore throat since yesterday,  Pt here with chid who also has a cough  The history is provided by the patient. No language interpreter was used.  Sore Throat This is a new problem. The problem occurs constantly. The problem has not changed since onset.Pertinent negatives include no shortness of breath. Nothing aggravates the symptoms. Nothing relieves the symptoms. She has tried nothing for the symptoms. The treatment provided no relief.    Past Medical History:  Diagnosis Date  . Medical history non-contributory     Patient Active Problem List   Diagnosis Date Noted  . Vaginal lesion 08/20/2019  . Excess weight 08/20/2019  . Contraception management 01/11/2019  . 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 Pulmonary nodules 12/06/2009  . PLEURISY 12/01/2009  . DEPRESSION, MAJOR, RECURRENT 03/27/2006    Past Surgical History:  Procedure Laterality Date  . I & D EXTREMITY Right    Middle finger    OB History    Gravida  1   Para  1   Term  1   Preterm      AB      Living  1     SAB      IAB      Ectopic      Multiple  0   Live Births  1            Home Medications    Prior to Admission medications   Medication Sig Start Date End Date Taking? Authorizing Provider  acetaminophen (TYLENOL) 325 MG tablet Take 650 mg by mouth every 6 (six) hours as needed for mild pain or moderate pain.     [provider]  cetirizine (ZYRTEC) 10 MG tablet Take 1 tablet (10 mg total) by mouth daily. 01/03/18   Bing Neighbors, FNP    Family History Family History  Problem Relation Age of Onset  . Cancer Mother        lung  . Cancer Father        prostate    Social History Social History   Tobacco Use  . Smoking status:  Former Smoker    Quit date: 01/28/2009    Years since quitting: 11.0  . Smokeless tobacco: Never Used  . Tobacco comment: 2011  Substance Use Topics  . Alcohol use: Yes    Comment: social, stopped prior to learning of preg  . Drug use: No     Allergies   Dust mite extract, Pollen extract, and Other   Review of Systems Review of Systems  Respiratory: Negative for shortness of breath.   All other systems reviewed and are negative.    Physical Exam Triage Vital Signs ED Triage Vitals  Enc Vitals Group     BP 02/04/20 1907 127/76     Pulse Rate 02/04/20 1907 94     Resp 02/04/20 1907 19     Temp 02/04/20 1907 98.8 F (37.1 C)     Temp src --      SpO2 02/04/20 1907 100 %     Weight --      Height --      Head Circumference --      Peak  Flow --      Pain Score 02/04/20 1900 0     Pain Loc --      Pain Edu? --      Excl. in GC? --    No data found.  Updated Vital Signs BP 127/76   Pulse 94   Temp 98.8 F (37.1 C)   Resp 19   SpO2 100%   Visual Acuity Right Eye Distance:   Left Eye Distance:   Bilateral Distance:    Right Eye Near:   Left Eye Near:    Bilateral Near:     Physical Exam Vitals and nursing note reviewed.  Constitutional:      Appearance: She is well-developed and well-nourished.  HENT:     Head: Normocephalic.     Mouth/Throat:     Mouth: Mucous membranes are moist.  Eyes:     Extraocular Movements: EOM normal.  Pulmonary:     Effort: Pulmonary effort is normal.  Abdominal:     General: There is no distension.  Musculoskeletal:        General: Normal range of motion.     Cervical back: Normal range of motion.  Neurological:     General: No focal deficit present.     Mental Status: She is alert and oriented to person, place, and time.  Psychiatric:        Mood and Affect: Mood and affect normal.      UC Treatments / Results  Labs (all labs ordered are listed, but only abnormal results are displayed) Labs Reviewed  SARS  CORONAVIRUS 2 (TAT 6-24 HRS)    EKG   Radiology No results found.  Procedures Procedures (including critical care time)  Medications Ordered in UC Medications - No data to display  Initial Impression / Assessment and Plan / UC Course  I have reviewed the triage vital signs and the nursing notes.  Pertinent labs & imaging results that were available during my care of the patient were reviewed by me and considered in my medical decision making (see chart for details).     MDM:  Covid pending.  Final Clinical Impressions(s) / UC Diagnoses   Final diagnoses:  Viral URI with cough   Discharge Instructions   None    ED Prescriptions    None     PDMP not reviewed this encounter.  An After Visit Summary was printed and given to the patient.   Elson Areas, [place] 02/04/20 1928

## 2020-02-05 LAB — SARS CORONAVIRUS 2 (TAT 6-24 HRS): SARS Coronavirus 2: POSITIVE — AB

## 2020-04-10 ENCOUNTER — Ambulatory Visit: Payer: Medicaid Other

## 2020-04-14 ENCOUNTER — Ambulatory Visit: Payer: Medicaid Other

## 2020-04-18 ENCOUNTER — Ambulatory Visit: Payer: Medicaid Other

## 2020-04-28 ENCOUNTER — Ambulatory Visit (INDEPENDENT_AMBULATORY_CARE_PROVIDER_SITE_OTHER): Payer: Medicaid Other | Admitting: *Deleted

## 2020-04-28 ENCOUNTER — Encounter: Payer: Self-pay | Admitting: *Deleted

## 2020-04-28 VITALS — BP 120/80 | HR 84 | Ht 64.0 in | Wt 120.5 lb

## 2020-04-28 DIAGNOSIS — Z3042 Encounter for surveillance of injectable contraceptive: Secondary | ICD-10-CM

## 2020-04-28 MED ORDER — MEDROXYPROGESTERONE ACETATE 150 MG/ML IM SUSP
150.0000 mg | Freq: Once | INTRAMUSCULAR | Status: AC
  Administered 2020-04-28: 150 mg via INTRAMUSCULAR

## 2020-04-28 NOTE — Progress Notes (Signed)
Pt presented earlier today for Depo Provera injection which was due 3/14-3/28. She reports no unprotected sex. Depo Provera 150 mg IM administered. Pt tolerated well. Next injection due 6/17-7/1. Last Pap done 01/05/19 by PCP. Per chart review, she has annual exam by PCP.

## 2020-05-04 NOTE — Progress Notes (Signed)
Patient was assessed and managed by nursing staff during this encounter. I have reviewed the chart and agree with the documentation and plan. I have also made any necessary editorial changes.  Ellarie Picking, MD 05/04/2020 9:48 AM   

## 2020-07-17 ENCOUNTER — Ambulatory Visit (INDEPENDENT_AMBULATORY_CARE_PROVIDER_SITE_OTHER): Payer: Medicaid Other

## 2020-07-17 ENCOUNTER — Other Ambulatory Visit: Payer: Self-pay

## 2020-07-17 VITALS — BP 120/76 | HR 87 | Wt 123.0 lb

## 2020-07-17 DIAGNOSIS — Z3042 Encounter for surveillance of injectable contraceptive: Secondary | ICD-10-CM

## 2020-07-17 NOTE — Progress Notes (Addendum)
Lauralei D Cwynar here for Depo-Provera Injection. Injection administered without complication. Patient will return in 3 months for next injection between 10/02/20 and 10/16/20. Next annual visit due October 2022.   [name], RN 07/17/2020  3:28 PM  Patient was assessed and managed by nursing staff during this encounter. I have reviewed the chart and agree with the documentation and plan.  Currie Paris, NP 07/19/2020 4:25 PM

## 2020-07-18 MED ORDER — MEDROXYPROGESTERONE ACETATE 150 MG/ML IM SUSP
150.0000 mg | Freq: Once | INTRAMUSCULAR | Status: AC
  Administered 2020-07-17: 150 mg via INTRAMUSCULAR

## 2020-07-27 DIAGNOSIS — Z20822 Contact with and (suspected) exposure to covid-19: Secondary | ICD-10-CM | POA: Diagnosis not present

## 2020-10-04 ENCOUNTER — Ambulatory Visit (INDEPENDENT_AMBULATORY_CARE_PROVIDER_SITE_OTHER): Payer: Medicaid Other

## 2020-10-04 ENCOUNTER — Other Ambulatory Visit: Payer: Self-pay

## 2020-10-04 VITALS — BP 128/79 | HR 73 | Wt 117.7 lb

## 2020-10-04 DIAGNOSIS — Z3042 Encounter for surveillance of injectable contraceptive: Secondary | ICD-10-CM

## 2020-10-04 MED ORDER — MEDROXYPROGESTERONE ACETATE 150 MG/ML IM SUSP
150.0000 mg | Freq: Once | INTRAMUSCULAR | Status: AC
  Administered 2020-10-04: 150 mg via INTRAMUSCULAR

## 2020-10-04 NOTE — Progress Notes (Signed)
Meghan Frederick here for Depo-Provera Injection. Injection administered without complication. Patient will return in 3 months for next injection between November 23 and December 7. Next annual visit due with next Depo Provera visit.   Ralene Bathe, RN 10/04/2020  4:03 PM

## 2020-10-06 NOTE — Progress Notes (Signed)
Patient seen and assessed by nursing staff.  Agree with documentation and plan.  

## 2020-11-08 ENCOUNTER — Encounter (HOSPITAL_COMMUNITY): Payer: Self-pay

## 2020-11-08 ENCOUNTER — Other Ambulatory Visit: Payer: Self-pay

## 2020-11-08 ENCOUNTER — Ambulatory Visit (HOSPITAL_COMMUNITY)
Admission: EM | Admit: 2020-11-08 | Discharge: 2020-11-08 | Disposition: A | Discharge status: Home / Self Care | Payer: Medicaid Other | Attending: Physician Assistant | Admitting: Physician Assistant

## 2020-11-08 DIAGNOSIS — Z8616 Personal history of COVID-19: Secondary | ICD-10-CM | POA: Diagnosis not present

## 2020-11-08 DIAGNOSIS — J302 Other seasonal allergic rhinitis: Secondary | ICD-10-CM | POA: Diagnosis not present

## 2020-11-08 DIAGNOSIS — Z87891 Personal history of nicotine dependence: Secondary | ICD-10-CM | POA: Diagnosis not present

## 2020-11-08 DIAGNOSIS — R051 Acute cough: Secondary | ICD-10-CM | POA: Insufficient documentation

## 2020-11-08 DIAGNOSIS — R0981 Nasal congestion: Secondary | ICD-10-CM | POA: Insufficient documentation

## 2020-11-08 DIAGNOSIS — Z20822 Contact with and (suspected) exposure to covid-19: Secondary | ICD-10-CM | POA: Diagnosis not present

## 2020-11-08 NOTE — ED Triage Notes (Signed)
Pt presents with cough, sneezing, and itchy throat X 7 days.

## 2020-11-08 NOTE — ED Provider Notes (Signed)
[facility]    CSN: [id number] Arrival date & time: 11/08/20  1101      History   Chief Complaint Chief Complaint  Patient presents with   Cough    HPI Meghan Frederick is a 35 y.o. female.   Patient presents today with a 7-day history of URI symptoms.  She reports coughing, itchy throat, sneezing.  Denies any shortness of breath, chest pain, fever, nausea, vomiting.  She has been taking over-the-counter antihistamines with improvement of symptoms.  She does have a history of allergies and states current symptoms are similar previous episodes of this condition.  She is up-to-date on immunizations but has not had COVID-19 or influenza vaccine.  She has had COVID before with the last episode approximately 4 months ago.  She denies any recent antibiotic use.  She denies any significant past medical history including asthma or COPD.  She does not smoke.  She is interested in COVID testing as her previous bout of COVID had similar symptoms that were mild and similar to allergy flares.   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 Authorizing Provider  acetaminophen (TYLENOL) 325 MG tablet Take 650 mg by mouth every 6 (six) hours as needed for mild pain or moderate pain.  Patient not taking:  Reported on 07/17/2020    [provider]  cetirizine (ZYRTEC) 10 MG tablet Take 1 tablet (10 mg total) by mouth daily. 01/03/18   Bing Neighbors, FNP    Family History Family History  Problem Relation Age of Onset   Cancer Mother        lung   Cancer Father        prostate    Social History Social History   Tobacco Use   Smoking status: Former    Types: Cigarettes    Quit date: 01/28/2009    Years since quitting: 11.7   Smokeless tobacco: Never   Tobacco comments:    2011  Substance Use Topics   Alcohol use: Yes    Comment: social, stopped prior to learning of preg   Drug use: No     Allergies   Dust mite extract, Pollen extract, and Other   Review of Systems Review of Systems  Constitutional:  Positive for activity change. Negative for appetite change, fatigue and fever.  HENT:  Positive for congestion and postnasal drip. Negative for sinus pressure, sneezing and sore throat.   Respiratory:  Positive for cough. Negative for shortness  of breath.   Cardiovascular:  Negative for chest pain.  Gastrointestinal:  Negative for abdominal pain, diarrhea, nausea and vomiting.  Neurological:  Negative for dizziness, light-headedness and headaches.    Physical Exam Triage Vital Signs ED Triage Vitals [11/08/20 1232]  Enc Vitals Group     BP 118/75     Pulse Rate 87     Resp 17     Temp 98.2 F (36.8 C)     Temp Source Oral     SpO2 98 %     Weight      Height      Head Circumference      Peak Flow      Pain Score 0     Pain Loc      Pain Edu?      Excl. in GC?    No data found.  Updated Vital Signs BP 118/75 (BP Location: Right Arm)   Pulse 87   Temp 98.2 F (36.8 C) (Oral)   Resp 17   SpO2 98%   Visual Acuity Right Eye Distance:   Left Eye Distance:   Bilateral Distance:    Right Eye Near:   Left Eye Near:    Bilateral Near:     Physical Exam Vitals reviewed.  Constitutional:      General: She is awake. She is not in acute  distress.    Appearance: Normal appearance. She is well-developed. She is not ill-appearing.     Comments: Very pleasant female appears stated age no acute distress sitting comfortably in exam room  HENT:     Head: Normocephalic and atraumatic.     Right Ear: Tympanic membrane, ear canal and external ear normal. Tympanic membrane is not erythematous or bulging.     Left Ear: Tympanic membrane, ear canal and external ear normal. Tympanic membrane is not erythematous or bulging.     Nose: Nose normal.     Right Sinus: No maxillary sinus tenderness or frontal sinus tenderness.     Left Sinus: No maxillary sinus tenderness or frontal sinus tenderness.     Mouth/Throat:     Pharynx: Uvula midline. No oropharyngeal exudate or posterior oropharyngeal erythema.  Cardiovascular:     Rate and Rhythm: Normal rate and regular rhythm.     Heart sounds: Normal heart sounds, S1 normal and S2 normal. No murmur heard. Pulmonary:     Effort: Pulmonary effort is normal.     Breath sounds: Normal breath sounds. No wheezing, rhonchi or rales.     Comments: Clear to auscultation bilaterally Abdominal:     General: Bowel sounds are normal.     Palpations: Abdomen is soft.     Tenderness: There is no abdominal tenderness.  Psychiatric:        Behavior: Behavior is cooperative.     UC Treatments / Results  Labs (all labs ordered are listed, but only abnormal results are displayed) Labs Reviewed  SARS CORONAVIRUS 2 (TAT 6-24 HRS)    EKG   Radiology No results found.  Procedures Procedures (including critical care time)  Medications Ordered in UC Medications - No data to display  Initial Impression / Assessment and Plan / UC Course  I have reviewed the triage vital signs and the nursing notes.  Pertinent labs & imaging results that were available during my care of the patient were reviewed by me and considered in my medical decision making (see chart for details).      Suspect symptoms  are related  to viral illness versus allergies.  No evidence of acute infection that warrant initiation of antibiotics.  COVID test was obtained per patient request-results pending.  She was provided work excuse note with current CDC return to work guidelines based on COVID test result.  Recommended she continue over-the-counter medications for symptom relief including antihistamine, Mucinex, Flonase, Tylenol.  Discussed alarm symptoms that warrant emergent evaluation.  Strict return precautions given to which she expressed understanding.  Final Clinical Impressions(s) / UC Diagnoses   Final diagnoses:  Seasonal allergies  Acute cough  Nasal congestion     Discharge Instructions      We tested you for COVID today.  We will contact you if this is positive.  Please remain in isolation until you receive your results.  Use over-the-counter medications as we discussed for symptom relief including Mucinex, Flonase, Tylenol, antihistamine.  If you have any worsening symptoms you need to be reevaluated.  Make sure you are resting and drinking plenty of fluid.     ED Prescriptions   None    PDMP not reviewed this encounter.   Jeani Hawking, PA-C 11/08/20 1314

## 2020-11-08 NOTE — Discharge Instructions (Addendum)
We tested you for COVID today.  We will contact you if this is positive.  Please remain in isolation until you receive your results.  Use over-the-counter medications as we discussed for symptom relief including Mucinex, Flonase, Tylenol, antihistamine.  If you have any worsening symptoms you need to be reevaluated.  Make sure you are resting and drinking plenty of fluid.

## 2020-11-09 LAB — SARS CORONAVIRUS 2 (TAT 6-24 HRS): SARS Coronavirus 2: NEGATIVE

## 2020-12-20 ENCOUNTER — Other Ambulatory Visit: Payer: Self-pay

## 2020-12-20 ENCOUNTER — Ambulatory Visit (INDEPENDENT_AMBULATORY_CARE_PROVIDER_SITE_OTHER): Payer: Medicaid Other

## 2020-12-20 VITALS — BP 113/76 | HR 79 | Wt 121.2 lb

## 2020-12-20 DIAGNOSIS — Z3042 Encounter for surveillance of injectable contraceptive: Secondary | ICD-10-CM

## 2020-12-20 MED ORDER — MEDROXYPROGESTERONE ACETATE 150 MG/ML IM SUSP
150.0000 mg | Freq: Once | INTRAMUSCULAR | Status: AC
  Administered 2020-12-20: 150 mg via INTRAMUSCULAR

## 2020-12-20 NOTE — Progress Notes (Signed)
Meghan Frederick here for Depo-Provera Injection. Injection administered without complication. Patient will return in 3 months for next injection between 03/07/21 and 03/21/21. Next annual visit due with next depo injection.   Cline Crock, RN 12/20/2020  3:26 PM

## 2020-12-20 NOTE — Progress Notes (Signed)
Chart reviewed - agree with CMA/RN documentation.  ° °

## 2020-12-25 DIAGNOSIS — Z20822 Contact with and (suspected) exposure to covid-19: Secondary | ICD-10-CM | POA: Diagnosis not present

## 2021-02-12 ENCOUNTER — Encounter (HOSPITAL_COMMUNITY): Payer: Self-pay | Admitting: Emergency Medicine

## 2021-02-12 ENCOUNTER — Emergency Department (HOSPITAL_COMMUNITY)
Admission: EM | Admit: 2021-02-12 | Discharge: 2021-02-12 | Disposition: A | Discharge status: Home / Self Care | Payer: Medicaid Other | Attending: Emergency Medicine | Admitting: Emergency Medicine

## 2021-02-12 DIAGNOSIS — S0592XA Unspecified injury of left eye and orbit, initial encounter: Secondary | ICD-10-CM | POA: Diagnosis present

## 2021-02-12 DIAGNOSIS — W228XXA Striking against or struck by other objects, initial encounter: Secondary | ICD-10-CM | POA: Diagnosis not present

## 2021-02-12 DIAGNOSIS — S0502XA Injury of conjunctiva and corneal abrasion without foreign body, left eye, initial encounter: Secondary | ICD-10-CM | POA: Diagnosis not present

## 2021-02-12 MED ORDER — FLUORESCEIN SODIUM 1 MG OP STRP
1.0000 | ORAL_STRIP | Freq: Once | OPHTHALMIC | Status: AC
  Administered 2021-02-12: 1 via OPHTHALMIC
  Filled 2021-02-12: qty 1

## 2021-02-12 MED ORDER — TETRACAINE HCL 0.5 % OP SOLN
2.0000 [drp] | Freq: Once | OPHTHALMIC | Status: AC
  Administered 2021-02-12: 2 [drp] via OPHTHALMIC
  Filled 2021-02-12: qty 4

## 2021-02-12 MED ORDER — CIPROFLOXACIN HCL 0.3 % OP SOLN: 2.0000 [drp] | Freq: Four times a day (QID) | OPHTHALMIC | 0 refills | Status: AC

## 2021-02-12 NOTE — ED Provider Notes (Signed)
[facility] EMERGENCY DEPARTMENT Provider Note   CSN: [id number] Arrival date & time: 02/12/21  1016     History  Chief Complaint  Patient presents with   Eye Pain    Meghan Frederick is a 36 y.o. female. Patient presents with left eye pain and redness.  She states that yesterday she had itchy eyes and was scratching her left eye a lot.  She says she sometimes gets eyelashes stuck in her eye and has similar problems to what she had today.  She states that she woke up this morning and felt like her left eye was very painful and difficult to open.  She feels like there is something stuck in her eye.  The symptoms have remained all day.  She denies any vision complaints, eye drainage, or other concerning symptoms.  She states that the pain is very superficial and not deep within her eye.  Eye Pain      Home Medications Prior to Admission medications   Medication Sig Start Date End Date Taking? Authorizing Provider  ciprofloxacin (CILOXAN) 0.3 % ophthalmic solution Place 2 drops into the left eye every 6 (six) hours for 10 days. Administer 1 drop, every 2 hours, while awake, for 2 days. Then 1 drop, every 4 hours, while awake, for the next 5 days. 02/12/21 02/22/21 Yes Val Schiavo, Adora Fridge, PA-C  acetaminophen (TYLENOL) 325 MG tablet Take 650 mg by mouth every 6 (six) hours as needed for mild pain or moderate pain.  Patient not taking: Reported on 07/17/2020    [provider]  cetirizine (ZYRTEC) 10 MG tablet Take 1 tablet (10 mg total) by mouth daily. Patient not taking: Reported on 12/20/2020 01/03/18   Scot Jun, FNP      Allergies    Bee pollen, Dust mite extract, Pollen extract, and Other    Review of Systems   Review of Systems  Eyes:  Positive for pain, redness and itching. Negative for discharge and visual disturbance.  All other systems reviewed and are negative.  Physical Exam Updated Vital Signs BP 117/76 (BP Location: Right Arm)    Pulse  76    Temp 97.8 F (36.6 C)    Resp 17    SpO2 99%  Physical Exam Vitals and nursing note reviewed.  Constitutional:      General: She is not in acute distress.    Appearance: She is not toxic-appearing.  HENT:     Head: Normocephalic and atraumatic.  Eyes:     General: Lids are normal. No scleral icterus.       Right eye: No foreign body, discharge or hordeolum.        Left eye: No foreign body, discharge or hordeolum.     Extraocular Movements:     Right eye: Normal extraocular motion and no nystagmus.     Left eye: Normal extraocular motion and no nystagmus.     Conjunctiva/sclera:     Left eye: Left conjunctiva is injected. Chemosis present. No exudate or hemorrhage.    Pupils: Pupils are equal, round, and reactive to light.     Left eye: Corneal abrasion present.     Slit lamp exam:    Left eye: No corneal ulcer, foreign body or hyphema.     Comments: There is tenderness overlying left eyelid. No overlying swelling or erythema. There is some conjunctival erythema superior to iris and pupil underneath upper eyelid.   Pulmonary:     Effort: No respiratory distress.  Neurological:     Mental Status: She is alert.  Psychiatric:        Mood and Affect: Mood normal.        Behavior: Behavior normal.    ED Results / Procedures / Treatments   Labs (all labs ordered are listed, but only abnormal results are displayed) Labs Reviewed - No data to display  EKG None  Radiology No results found.  Procedures Procedures   Medications Ordered in ED Medications  fluorescein ophthalmic strip 1 strip (1 strip Left Eye Given 02/12/21 1450)  tetracaine (PONTOCAINE) 0.5 % ophthalmic solution 2 drop (2 drops Left Eye Given 02/12/21 1450)    ED Course/ Medical Decision Making/ A&P                           Medical Decision Making Problems Addressed: Abrasion of left cornea, initial encounter: acute illness or injury  Risk Prescription drug management.   Presentation  consistent with corneal abrasion or ulcer likely from rubbing eyes yesterday. She is not a contacts user.  Eye exam revealed corneal abrasion. Vision is intact. Pain is superficial, no eye drainage. No overlying blepharitis or cellulitis.  Plan to discharge on cipro drops. F/u  with Optho if sx do not start improving, return precautions provided.   Final Clinical Impression(s) / ED Diagnoses Final diagnoses:  Abrasion of left cornea, initial encounter    Rx / DC Orders ED Discharge Orders          Ordered    ciprofloxacin (CILOXAN) 0.3 % ophthalmic solution  Every 6 hours        02/12/21 1443              Aziah Kaiser, Adora Fridge, PA-C 02/12/21 1456    Isla Pence, MD 02/12/21 1605

## 2021-02-12 NOTE — ED Provider Triage Note (Signed)
Emergency Medicine Provider Triage Evaluation Note  Meghan Frederick , a 36 y.o. female  was evaluated in triage.  Pt complains of right eye irritation..  Patient states that she woke up early before her alarm and felt like there was something in her eye.  She tried to rub her eye and lift the lid and noticed that her eye was burning severely.  It began watering and she has pain in the eye since that time.  She denies changes in vision, photophobia, contact lens wearing or injury.  Review of Systems  Positive: Eye irritation Negative: Discharge  Physical Exam  BP 117/76 (BP Location: Right Arm)    Pulse 76    Temp 97.8 F (36.6 C)    Resp 17    SpO2 99%  Gen:   Awake, no distress   Resp:  Normal effort  MSK:   Moves extremities without difficulty  Other:  Erythema, swelling under the medial aspect of the upper eyelid on the left, no photophobia or conjunctival injection  Medical Decision Making  Medically screening exam initiated at 11:19 AM.  Appropriate orders placed.  Meghan Frederick was informed that the remainder of the evaluation will be completed by another provider, this initial triage assessment does not replace that evaluation, and the importance of remaining in the ED until their evaluation is complete.  Work-up initiated   Arthor Captain, PA-C 02/12/21 1121

## 2021-02-12 NOTE — Discharge Instructions (Addendum)
I have prescribed you eye drop antibiotics for your symptoms. Please take these as prescribed.  Please schedule an appt with Dr. Sallye Lat if symptoms do not start to improve.

## 2021-02-12 NOTE — ED Triage Notes (Signed)
Patient here with complaint of left eye pain and redness that started this morning. Patient states she attempted to wash eye with tap water but stopped due to burning sensation.

## 2021-02-13 ENCOUNTER — Telehealth: Payer: Self-pay

## 2021-02-13 NOTE — Telephone Encounter (Signed)
Transition Care Management Follow-up Telephone Call Date of discharge and from where: 02/12/2021 from [facility] How have you been since you were released from the hospital? Pt stated that she is feeling much better. Pt stated that her vision has improved.  Any questions or concerns? No  Items Reviewed: Did the pt receive and understand the discharge instructions provided? Yes  Medications obtained and verified? Yes  Other? No  Any new allergies since your discharge? No  Dietary orders reviewed? No Do you have support at home? Yes   Functional Questionnaire: (I = Independent and D = Dependent) ADLs: I  Bathing/Dressing- I  Meal Prep- I  Eating- I  Maintaining continence- I  Transferring/Ambulation- I  Managing Meds- I   Follow up appointments reviewed:  PCP Hospital f/u appt confirmed? No  Scheduled to see Burley Saver, MD on 02/16/2021 @ 8:50am. Specialist Hospital f/u appt confirmed? No   Are transportation arrangements needed? No  If their condition worsens, is the pt aware to call PCP or go to the Emergency Dept.? Yes Was the patient provided with contact information for the PCP's office or ED? Yes Was to pt encouraged to call back with questions or concerns? Yes

## 2021-02-15 NOTE — Progress Notes (Signed)
° ° °  SUBJECTIVE:   CHIEF COMPLAINT / HPI:   Abdominal pain-patient notes that after vaginal delivery in 2018 her belly fat button "felt weird and like she could feel a cord."  She notes sometimes it was also hard and painful around her bellybutton.  About 1 year later she notes that she will occasionally have sharp pains in her bellybutton where she has to stop and brace herself it happens a few times a week, no appreciable trigger, not worse with exertion or bearing down, will last for few minutes and then resolved.  She denies any purple discoloration, nausea, vomiting, constipation, diarrhea, blood in stool.  She has not had any previous abdominal surgeries.  She has had a bellybutton ring but took it out prior to her pregnancy in 2018 has never put it back in since.  She does not notice anything bulging out.  She is wondering if it could be a hernia.  PERTINENT  PMH / PSH: depression  OBJECTIVE:   BP 102/70    Pulse 68    Ht 5\' 4"  (1.626 m)    Wt 116 lb 12.8 oz (53 kg)    SpO2 97%    BMI 20.05 kg/m   General: alert & oriented, no apparent distress, well groomed HEENT: normocephalic, atraumatic, EOM grossly intact, oral mucosa moist, neck supple Respiratory: normal respiratory effort GI: non-distended, small 1 cm umbilical hernia noted, non-TTP, no obstruction or trapped tissue, no erythema or induration or purple discoloration, no bulge with Valsalva Skin: no rashes, no jaundice Psych: appropriate mood and affect   ASSESSMENT/PLAN:   Umbilical hernia without obstruction and without gangrene - noted on exam today, [place] scheduled to confirm size - referral to general surgery placed to discuss closure - discuss signs/symptoms of obstruction with patient and return precautions, no signs/symptoms of gangrene or obstruction today   Patient declined flu vaccine today.  Lenoria Chime, MD [place]

## 2021-02-16 ENCOUNTER — Encounter: Payer: Self-pay | Admitting: Family Medicine

## 2021-02-16 ENCOUNTER — Other Ambulatory Visit: Payer: Self-pay

## 2021-02-16 ENCOUNTER — Ambulatory Visit (INDEPENDENT_AMBULATORY_CARE_PROVIDER_SITE_OTHER): Payer: Medicaid Other | Admitting: Family Medicine

## 2021-02-16 VITALS — BP 102/70 | HR 68 | Ht 64.0 in | Wt 116.8 lb

## 2021-02-16 DIAGNOSIS — K429 Umbilical hernia without obstruction or gangrene: Secondary | ICD-10-CM | POA: Insufficient documentation

## 2021-02-16 NOTE — Patient Instructions (Addendum)
It was wonderful to see you today.  Please bring ALL of your medications with you to every visit.   Today we talked about:  I think you have an umbilical hernia, we will refer you to general surgery to discuss surgical repair and get an [place] to confirm if any tissue entrapment  If you notice a bulge around her bellybutton that gets stuck, it is extremely painful, or turns red or purple, or if you stop passing gas or unable to have bowel movements you need to go immediately to the ED as this can be a sign of something getting stuck in your hernia.  Also if you start having fevers, chills, worsening abdominal pain recommend going to the ED.   Thank you for choosing [facility].   Please call [phone] with any questions about today's appointment.  Please be sure to schedule follow up at the front  desk before you leave today.   Yehuda Savannah, MD  Family Medicine

## 2021-02-16 NOTE — Assessment & Plan Note (Signed)
-   noted on exam today, [place] scheduled to confirm size - referral to general surgery placed to discuss closure - discuss signs/symptoms of obstruction with patient and return precautions, no signs/symptoms of gangrene or obstruction today

## 2021-02-23 ENCOUNTER — Ambulatory Visit (HOSPITAL_COMMUNITY): Payer: Medicaid Other

## 2021-02-23 ENCOUNTER — Ambulatory Visit (HOSPITAL_COMMUNITY)
Admission: RE | Admit: 2021-02-23 | Discharge: 2021-02-23 | Disposition: A | Discharge status: Home / Self Care | Payer: Medicaid Other | Source: Ambulatory Visit | Attending: Family Medicine | Admitting: Family Medicine

## 2021-02-23 ENCOUNTER — Other Ambulatory Visit: Payer: Self-pay

## 2021-02-23 DIAGNOSIS — K429 Umbilical hernia without obstruction or gangrene: Secondary | ICD-10-CM | POA: Diagnosis not present

## 2021-02-23 DIAGNOSIS — Z0389 Encounter for observation for other suspected diseases and conditions ruled out: Secondary | ICD-10-CM | POA: Diagnosis not present

## 2021-02-26 ENCOUNTER — Encounter: Payer: Self-pay | Admitting: Family Medicine

## 2021-02-27 ENCOUNTER — Telehealth: Payer: Self-pay

## 2021-02-27 NOTE — Telephone Encounter (Signed)
See phone note. Beaulah Romanek C Grecia Lynk, RN  

## 2021-02-27 NOTE — Telephone Encounter (Signed)
Called patient. Patient reports small amount of bright red blood in stool for a few weeks. Patient states that she continues to have pain directly in belly button, that is painful to the touch. Reports that currently abdominal pain is a 7.5/10 but is not unbearable.   Per [place] report from 1/27, no hernia was identified.   I precepted with Dr. Leveda Anna, recommended that patient schedule follow up for next available. Offered patient appointment for tomorrow, however, patient elected to schedule for Friday due to work schedule.   Reviewed red flags and provided with ED precautions. Patient verbalizes understanding.   Veronda Prude, RN

## 2021-03-02 ENCOUNTER — Encounter: Payer: Self-pay | Admitting: Student

## 2021-03-02 ENCOUNTER — Ambulatory Visit (INDEPENDENT_AMBULATORY_CARE_PROVIDER_SITE_OTHER): Payer: Medicaid Other | Admitting: Student

## 2021-03-02 ENCOUNTER — Other Ambulatory Visit: Payer: Self-pay

## 2021-03-02 VITALS — BP 108/81 | HR 76 | Wt 118.6 lb

## 2021-03-02 DIAGNOSIS — K429 Umbilical hernia without obstruction or gangrene: Secondary | ICD-10-CM

## 2021-03-02 NOTE — Assessment & Plan Note (Addendum)
Reducible, small umbilical hernia on abdominal exam today. Abdomen is not acute- soft with normoactive bowel sounds. No concern for strangulation or incarceration of hernia. Explained that these complications are not common. Educated patient to brace abdomen with coughing/sneezing/movements that increase abdominal pressure to reduce movement of hernia through umbilical ring that can cause pain. She has follow-up with general surgery on 03/06/2021 for this concern.

## 2021-03-02 NOTE — Patient Instructions (Signed)
It was great to meet you, Meghan Frederick. Thanks for coming in.  I believe your belly button pain is due to your hernia, although I am not concerned right now for any complications as we discussed.  Please go to your general surgery consultation on 03/06/21 and follow-up with our clinic afterward.  Best wishes, Dr. Melissa Noon

## 2021-03-02 NOTE — Progress Notes (Signed)
° ° °  SUBJECTIVE:   CHIEF COMPLAINT / HPI:   Umbilical abdominal pain; Hx umbilical hernia without obstruction Seen 1/20 in office with small 1cm non-distended umbilical hernia without obstruction or bulge with Valsalva.   Presents today with worsening pain, rated 7/10 and increasing in frequency- basically all day. Taking Tylenol for pain with some improvement. Not worse with exertion or bearing down. She has noticed her belly button "pooches out more than usual." Has consultation scheduled with general surgery on 03/06/21. No nausea or vomiting. Has been having diarrhea.  PERTINENT  PMH / PSH: Major depression  OBJECTIVE:   BP 108/81    Pulse 76    Wt 118 lb 9.6 oz (53.8 kg)    SpO2 99%    BMI 20.36 kg/m   General: Alert, well-appearing female in no distress CV: Regular rate Resp: Normal work of breathing on room air GI: Normal, active bowel sounds. Non-distended abdomen. Small 1cm umbilical hernia, reducible but tender. No bulging with Valsalva. Psych: Pleasant. Normal mood and affect.   ASSESSMENT/PLAN:   Umbilical hernia without obstruction and without gangrene Reducible, small umbilical hernia on abdominal exam today. Abdomen is not acute- soft with normoactive bowel sounds. No concern for strangulation or incarceration of hernia. Explained that these complications are not common. Educated patient to brace abdomen with coughing/sneezing/movements that increase abdominal pressure to reduce movement of hernia through umbilical ring that can cause pain. She has follow-up with general surgery on 03/06/2021 for this concern.     Darral Dash, DO [facility]

## 2021-03-06 DIAGNOSIS — K429 Umbilical hernia without obstruction or gangrene: Secondary | ICD-10-CM | POA: Diagnosis not present

## 2021-03-12 ENCOUNTER — Telehealth: Payer: Self-pay | Admitting: Family Medicine

## 2021-03-12 NOTE — Telephone Encounter (Signed)
I called to discuss her PCP switch request. Unfortunately, her PCP is currently on maternity leave and will not return till March. I advised her that per policy, we need to discuss the request with her current PCP and consider deferring this request until her PCP returns. She agreed with the plan. I will reach out again in March.

## 2021-03-14 DIAGNOSIS — K429 Umbilical hernia without obstruction or gangrene: Secondary | ICD-10-CM | POA: Diagnosis not present

## 2021-03-14 HISTORY — PX: UMBILICAL HERNIA REPAIR: SHX196

## 2021-04-02 NOTE — Telephone Encounter (Signed)
I called patient and discussed her PCP change request. After discussion, we amicably both agreed for her to have a new PCP. She would like Dr. Miquel Dunn if possible. Advised I will forward this to Dr. Lum Babe, who will be in touch with patient to follow up. Patient appreciative. ? ?Latrelle Dodrill, MD  ?

## 2021-04-05 DIAGNOSIS — K429 Umbilical hernia without obstruction or gangrene: Secondary | ICD-10-CM | POA: Diagnosis not present

## 2021-04-10 DIAGNOSIS — K429 Umbilical hernia without obstruction or gangrene: Secondary | ICD-10-CM | POA: Diagnosis not present

## 2021-04-27 ENCOUNTER — Encounter: Payer: Self-pay | Admitting: Family Medicine

## 2021-04-27 NOTE — Progress Notes (Signed)
No show for appt. 

## 2021-05-01 ENCOUNTER — Encounter: Payer: Self-pay | Admitting: Obstetrics and Gynecology

## 2021-05-01 ENCOUNTER — Ambulatory Visit (INDEPENDENT_AMBULATORY_CARE_PROVIDER_SITE_OTHER): Payer: Medicaid Other | Admitting: Obstetrics and Gynecology

## 2021-05-01 VITALS — BP 116/90 | HR 90 | Wt 120.7 lb

## 2021-05-01 DIAGNOSIS — Z01419 Encounter for gynecological examination (general) (routine) without abnormal findings: Secondary | ICD-10-CM | POA: Diagnosis not present

## 2021-05-01 DIAGNOSIS — Z3042 Encounter for surveillance of injectable contraceptive: Secondary | ICD-10-CM

## 2021-05-01 DIAGNOSIS — Z30013 Encounter for initial prescription of injectable contraceptive: Secondary | ICD-10-CM

## 2021-05-01 DIAGNOSIS — Z202 Contact with and (suspected) exposure to infections with a predominantly sexual mode of transmission: Secondary | ICD-10-CM | POA: Insufficient documentation

## 2021-05-01 LAB — POCT PREGNANCY, URINE: Preg Test, Ur: NEGATIVE

## 2021-05-01 MED ORDER — MEDROXYPROGESTERONE ACETATE 150 MG/ML IM SUSP
150.0000 mg | Freq: Once | INTRAMUSCULAR | Status: AC
  Administered 2021-05-01: 150 mg via INTRAMUSCULAR

## 2021-05-01 NOTE — Patient Instructions (Signed)

## 2021-05-01 NOTE — Progress Notes (Signed)
Meghan Frederick is a 36 y.o. G11P1001 female here for a routine annual gynecologic exam.  Current complaints: None.   Denies abnormal vaginal bleeding, discharge, pelvic pain, problems with intercourse or other gynecologic concerns.  ? Wants to restart Depo Provera for contraception and desires STD testing ? ?Gynecologic History ?No LMP recorded. Patient has had an injection. ?Contraception: Depo-Provera injections ?Last Pap: 2021. Results were: normal ?Last mammogram: NA. ? ?Obstetric History ?OB History  ?Gravida Para Term Preterm AB Living  ?1 1 1     1   ?SAB IAB Ectopic Multiple Live Births  ?      0 1  ?  ?# Outcome Date GA Lbr Len/2nd Weight Sex Delivery Anes PTL Lv  ?1 Term 03/15/17 [redacted]w[redacted]d 05:25 / 03:01 6 lb 12.8 oz (3.084 kg) F Vag-Spont EPI  LIV  ?   Birth Comments: none  ? ? ?Past Medical History:  ?Diagnosis Date  ? Medical history non-contributory   ? ? ?Past Surgical History:  ?Procedure Laterality Date  ? I & D EXTREMITY Right   ? Middle finger  ? UMBILICAL HERNIA REPAIR    ? ? ?Current Outpatient Medications on File Prior to Visit  ?Medication Sig Dispense Refill  ? cetirizine (ZYRTEC) 10 MG tablet Take 10 mg by mouth daily.    ? ?Current Facility-Administered Medications on File Prior to Visit  ?Medication Dose Route Frequency Provider Last Rate Last Admin  ? medroxyPROGESTERone (DEPO-PROVERA) injection 150 mg  150 mg Intramuscular Q90 days [redacted]w[redacted]d, MD   150 mg at 08/11/19 1500  ? ? ?Allergies  ?Allergen Reactions  ? Bee Pollen Itching  ? Dust Mite Extract Itching and Other (See Comments)  ?  Runny nose, sore throat ?Other reaction(s): Other (See Comments) ?Runny nose, sore throat  ? Pollen Extract Itching  ? Other Rash  ?  Green peppers ?mushrooms  ? ? ?Social History  ? ?Socioeconomic History  ? Marital status: Single  ?  Spouse name: Not on file  ? Number of children: Not on file  ? Years of education: Not on file  ? Highest education level: Not on file  ?Occupational History  ? Not on  file  ?Tobacco Use  ? Smoking status: Former  ?  Types: Cigarettes  ?  Quit date: 01/28/2009  ?  Years since quitting: 12.2  ? Smokeless tobacco: Never  ? Tobacco comments:  ?  2011  ?Substance and Sexual Activity  ? Alcohol use: Yes  ?  Comment: social, stopped prior to learning of preg  ? Drug use: No  ? Sexual activity: Not Currently  ?Other Topics Concern  ? Not on file  ?Social History Narrative  ? Not on file  ? ?Social Determinants of Health  ? ?Financial Resource Strain: Not on file  ?Food Insecurity: No Food Insecurity  ? Worried About 2012 in the Last Year: Never true  ? Ran Out of Food in the Last Year: Never true  ?Transportation Needs: No Transportation Needs  ? Lack of Transportation (Medical): No  ? Lack of Transportation (Non-Medical): No  ?Physical Activity: Not on file  ?Stress: Not on file  ?Social Connections: Not on file  ?Intimate Partner Violence: Not on file  ? ? ?Family History  ?Problem Relation Age of Onset  ? Cancer Mother   ?     lung  ? Cancer Father   ?     prostate  ? ? ?The following portions of the patient's  history were reviewed and updated as appropriate: allergies, current medications, past family history, past medical history, past social history, past surgical history and problem list. ? ?Review of Systems ?Pertinent items are noted in HPI. ?  ?Objective:  ?BP 116/90   Pulse 90   Wt 120 lb 11.2 oz (54.7 kg)   BMI 20.72 kg/m?  ?CONSTITUTIONAL: Well-developed, well-nourished female in no acute distress.  ?HENT:  Normocephalic, atraumatic, External right and left ear normal. Oropharynx is clear and moist ?EYES: Conjunctivae and EOM are normal. Pupils are equal, round, and reactive to light. No scleral icterus.  ?NECK: Normal range of motion, supple, no masses.  Normal thyroid.  ?SKIN: Skin is warm and dry. No rash noted. Not diaphoretic. No erythema. No pallor. ?NEUROLGIC: Alert and oriented to person, place, and time. Normal reflexes, muscle tone coordination. No  cranial nerve deficit noted. ?PSYCHIATRIC: Normal mood and affect. Normal behavior. Normal judgment and thought content. ?CARDIOVASCULAR: Normal heart rate noted, regular rhythm ?RESPIRATORY: Clear to auscultation bilaterally. Effort and breath sounds normal, no problems with respiration noted. ?BREASTS: Deferred ?ABDOMEN: Soft, normal bowel sounds, no distention noted.  No tenderness, rebound or guarding.  ?PELVIC: Deferred ?MUSCULOSKELETAL: Normal range of motion. No tenderness.  No cyanosis, clubbing, or edema.  2+ distal pulses. ? ? ?Assessment:  ?Annual gynecologic examination without pap smear ?Contraception management ?STD testing ?Plan:  ?Will restart Depo Provera. Std testing as per pt request ?Routine preventative health maintenance measures emphasized. ?Please refer to After Visit Summary for other counseling recommendations.  ? ? ?Hermina Staggers, MD, FACOG ?Attending Obstetrician & Gynecologist ?Center for [employer], [facility]  ?

## 2021-05-01 NOTE — Addendum Note (Signed)
Addended by: Cline Crock on: 05/01/2021 03:34 PM ? ? Modules accepted: Orders ? ?

## 2021-05-02 ENCOUNTER — Ambulatory Visit (INDEPENDENT_AMBULATORY_CARE_PROVIDER_SITE_OTHER): Payer: Medicaid Other | Admitting: Family Medicine

## 2021-05-02 DIAGNOSIS — Z01419 Encounter for gynecological examination (general) (routine) without abnormal findings: Secondary | ICD-10-CM

## 2021-05-02 LAB — HEPATITIS C ANTIBODY: Hep C Virus Ab: NONREACTIVE

## 2021-05-02 LAB — RPR: RPR Ser Ql: NONREACTIVE

## 2021-05-02 LAB — HIV ANTIBODY (ROUTINE TESTING W REFLEX): HIV Screen 4th Generation wRfx: NONREACTIVE

## 2021-05-02 LAB — HEPATITIS B SURFACE ANTIGEN: Hepatitis B Surface Ag: NEGATIVE

## 2021-06-22 ENCOUNTER — Ambulatory Visit (INDEPENDENT_AMBULATORY_CARE_PROVIDER_SITE_OTHER): Payer: Medicaid Other | Admitting: Student

## 2021-06-22 ENCOUNTER — Encounter: Payer: Self-pay | Admitting: Student

## 2021-06-22 VITALS — BP 110/70 | HR 83 | Ht 64.0 in | Wt 120.0 lb

## 2021-06-22 DIAGNOSIS — K429 Umbilical hernia without obstruction or gangrene: Secondary | ICD-10-CM | POA: Diagnosis not present

## 2021-06-22 DIAGNOSIS — K5900 Constipation, unspecified: Secondary | ICD-10-CM | POA: Diagnosis not present

## 2021-06-22 NOTE — Patient Instructions (Signed)
It was great to see you today! Thank you for choosing [facility] for your primary care. Meghan Frederick was seen for umbilical hernia follow-up.  Today we addressed: Abdominal exam is normal and I am glad that you are symptoms have greatly improved postsurgery.  No further follow-up is required unless you begin to experience new or worsening symptoms.  If you haven't already, sign up for My Chart to have easy access to your labs results, and communication with your primary care physician.  Please arrive 15 minutes before your appointment to ensure smooth check in process.  We appreciate your efforts in making this happen.  Please call the clinic at [phone] if your symptoms worsen or you have any concerns.  Thank you for allowing me to participate in your care, Shelby Mattocks, DO 06/22/2021, 2:23 PM PGY-1, [facility]

## 2021-06-22 NOTE — Assessment & Plan Note (Addendum)
No umbilical hernia appreciated.  Drastic improvement since time of surgery.  Minute blood in the outer portion of stool discussed with patient and not concerning for colonic ischemia.  Discussed return precautions for the future.

## 2021-06-22 NOTE — Progress Notes (Signed)
  SUBJECTIVE:   CHIEF COMPLAINT / HPI:   Follow-up after umbilical hernia repair: Patient states that after the repair on 03/14/2021, she healed well and she has only had pain 2-3 times and has been tolerable and not requiring any sort of pain medication.  When prompted about blood in her stool, patient states she has had a very tiny amount but does strain when pooping.  Otherwise has done well.  PERTINENT  PMH / PSH: Umbilical hernia repair  OBJECTIVE:  BP 110/70   Pulse 83   Ht 5\' 4"  (1.626 m)   Wt 120 lb (54.4 kg)   SpO2 98%   BMI 20.60 kg/m   General: NAD, pleasant, able to participate in exam Abdomen: soft, non-tender, non-distended, normoactive bowel sounds  ASSESSMENT/PLAN:  Constipation Advised MiraLAX and increase fiber in diet.  Umbilical hernia without obstruction and without gangrene No umbilical hernia appreciated.  Drastic improvement since time of surgery.  Minute blood in the outer portion of stool discussed with patient and not concerning for colonic ischemia.  Discussed return precautions for the future.   Wells Guiles, DO 06/22/2021, 2:53 PM PGY-1, [place]

## 2021-06-22 NOTE — Assessment & Plan Note (Signed)
Advised MiraLAX and increase fiber in diet.

## 2021-07-03 ENCOUNTER — Encounter: Payer: Self-pay | Admitting: *Deleted

## 2021-07-17 ENCOUNTER — Ambulatory Visit (INDEPENDENT_AMBULATORY_CARE_PROVIDER_SITE_OTHER): Payer: Medicaid Other

## 2021-07-17 ENCOUNTER — Other Ambulatory Visit: Payer: Self-pay

## 2021-07-17 VITALS — BP 117/77 | HR 76 | Wt 121.0 lb

## 2021-07-17 DIAGNOSIS — Z3042 Encounter for surveillance of injectable contraceptive: Secondary | ICD-10-CM

## 2021-07-17 MED ORDER — MEDROXYPROGESTERONE ACETATE 150 MG/ML IM SUSP
150.0000 mg | Freq: Once | INTRAMUSCULAR | Status: AC
  Administered 2021-07-17: 150 mg via INTRAMUSCULAR

## 2021-07-17 NOTE — Progress Notes (Signed)
Meghan Frederick here for Depo-Provera Injection. Injection administered without complication. Patient will return in 3 months for next injection between September 5th and September 19th. Next annual visit due April 2024.   Cline Crock, RN 07/17/2021

## 2021-10-02 ENCOUNTER — Ambulatory Visit: Payer: Medicaid Other

## 2021-10-16 ENCOUNTER — Ambulatory Visit (INDEPENDENT_AMBULATORY_CARE_PROVIDER_SITE_OTHER): Payer: Medicaid Other

## 2021-10-16 VITALS — BP 117/81 | HR 78 | Wt 120.0 lb

## 2021-10-16 DIAGNOSIS — Z3042 Encounter for surveillance of injectable contraceptive: Secondary | ICD-10-CM

## 2021-10-16 MED ORDER — MEDROXYPROGESTERONE ACETATE 150 MG/ML IM SUSP
150.0000 mg | Freq: Once | INTRAMUSCULAR | Status: AC
  Administered 2021-10-16: 150 mg via INTRAMUSCULAR

## 2021-10-16 NOTE — Progress Notes (Signed)
Meghan Frederick here for Depo-Provera Injection. Injection administered without complication. Patient will return in 3 months for next injection between 01/01/22 and 01/15/22. Next annual visit due April 2024.   Annabell Howells, RN 10/16/2021  3:05 PM

## 2022-01-15 ENCOUNTER — Other Ambulatory Visit: Payer: Self-pay

## 2022-01-15 ENCOUNTER — Ambulatory Visit (INDEPENDENT_AMBULATORY_CARE_PROVIDER_SITE_OTHER): Payer: Medicaid Other

## 2022-01-15 VITALS — BP 119/82 | HR 69

## 2022-01-15 DIAGNOSIS — Z3042 Encounter for surveillance of injectable contraceptive: Secondary | ICD-10-CM | POA: Diagnosis not present

## 2022-01-15 MED ORDER — MEDROXYPROGESTERONE ACETATE 150 MG/ML IM SUSP
150.0000 mg | Freq: Once | INTRAMUSCULAR | Status: AC
  Administered 2022-01-15: 150 mg via INTRAMUSCULAR

## 2022-01-15 NOTE — Progress Notes (Unsigned)
Meghan Frederick App here for Depo-Provera Injection. Injection administered without complication. Patient will return in 3 months for next injection between 04/03/22 and 04/17/22. Next annual visit due April 2024.   [name], RN 01/15/2022  3:34 PM

## 2022-02-20 ENCOUNTER — Emergency Department (HOSPITAL_COMMUNITY)
Admission: EM | Admit: 2022-02-20 | Discharge: 2022-02-20 | Disposition: A | Discharge status: Home / Self Care | Payer: Medicaid Other | Attending: Emergency Medicine | Admitting: Emergency Medicine

## 2022-02-20 ENCOUNTER — Other Ambulatory Visit: Payer: Self-pay

## 2022-02-20 DIAGNOSIS — J111 Influenza due to unidentified influenza virus with other respiratory manifestations: Secondary | ICD-10-CM

## 2022-02-20 DIAGNOSIS — J101 Influenza due to other identified influenza virus with other respiratory manifestations: Secondary | ICD-10-CM | POA: Insufficient documentation

## 2022-02-20 DIAGNOSIS — R059 Cough, unspecified: Secondary | ICD-10-CM | POA: Diagnosis present

## 2022-02-20 DIAGNOSIS — Z1152 Encounter for screening for COVID-19: Secondary | ICD-10-CM | POA: Insufficient documentation

## 2022-02-20 LAB — RESP PANEL BY RT-PCR (RSV, FLU A&B, COVID)  RVPGX2
Influenza A by PCR: POSITIVE — AB
Influenza B by PCR: NEGATIVE
Resp Syncytial Virus by PCR: NEGATIVE
SARS Coronavirus 2 by RT PCR: NEGATIVE

## 2022-02-20 LAB — GROUP A STREP BY PCR: Group A Strep by PCR: NOT DETECTED

## 2022-02-20 MED ORDER — LIDOCAINE VISCOUS HCL 2 % MT SOLN
15.0000 mL | Freq: Once | OROMUCOSAL | Status: AC
  Administered 2022-02-20: 15 mL via OROMUCOSAL
  Filled 2022-02-20: qty 15

## 2022-02-20 NOTE — ED Triage Notes (Signed)
Pt. Stated, Ive had sore throat with a cough which is better with body aches for over 3 days.

## 2022-02-20 NOTE — Discharge Instructions (Signed)
You were seen in the emergency department today for cough.  You have influenza.  Please rest, drink plenty of fluids and eat a gentle diet.  May take Tylenol or ibuprofen for fever and bodyaches.  Please return to the emergency department for worsening shortness of breath or difficulty breathing and fever concerning for pneumonia.

## 2022-02-20 NOTE — ED Provider Notes (Signed)
[facility] Provider Note   CSN: [id number] Arrival date & time: 02/20/22  6378     History  Chief Complaint  Patient presents with   Sore Throat   Cough   bodyaches    Tracee Mccreery Pizzuto is a 37 y.o. female.  With no significant past medical history who presents to the emergency department with cough.  Patient states that she has had about 3 days of dry cough, sore throat and bodyaches.  States that her daughter was sick last week with similar symptoms and now her and significant other are both ill.  She has been using over-the-counter cough medication as well as Advil for symptomatic relief of symptoms.  She denies having objective fever but states she has been sweating at night over the past 2 days.  Also endorsed having diarrhea on Monday which has since improved.  She denies having chest pain, shortness of breath or difficulty breathing.  She has been tolerating p.o. at home.   Sore Throat Pertinent negatives include no shortness of breath.  Cough Associated symptoms: chills and rhinorrhea   Associated symptoms: no shortness of breath        Home Medications Prior to Admission medications   Medication Sig Start Date End Date Taking? Authorizing Provider  cetirizine (ZYRTEC) 10 MG tablet Take 10 mg by mouth daily.    [provider]      Allergies    Bee pollen, Dust mite extract, Pollen extract, and Other    Review of Systems   Review of Systems  Constitutional:  Positive for chills and fatigue.  HENT:  Positive for rhinorrhea.   Respiratory:  Positive for cough. Negative for shortness of breath.   All other systems reviewed and are negative.   Physical Exam Updated Vital Signs BP (!) 131/99 (BP Location: Right Arm)   Pulse (!) 102   Temp 98.8 F (37.1 C) (Oral)   Resp 18   Ht 5' 4.5" (1.638 m)   Wt 55.8 kg   SpO2 100%   BMI 20.79 kg/m  Physical Exam Vitals and nursing note reviewed.  Constitutional:       General: She is not in acute distress.    Appearance: Normal appearance. She is well-developed. She is ill-appearing. She is not toxic-appearing.  HENT:     Head: Normocephalic.     Nose: Congestion present.     Mouth/Throat:     Mouth: Mucous membranes are moist.     Pharynx: Uvula midline. Posterior oropharyngeal erythema present. No oropharyngeal exudate or uvula swelling.     Tonsils: No tonsillar exudate or tonsillar abscesses. 0 on the right. 0 on the left.  Eyes:     General: No scleral icterus.    Conjunctiva/sclera: Conjunctivae normal.     Pupils: Pupils are equal, round, and reactive to light.  Cardiovascular:     Rate and Rhythm: Normal rate and regular rhythm.     Heart sounds: Normal heart sounds. No murmur heard. Pulmonary:     Effort: Pulmonary effort is normal. No respiratory distress.     Breath sounds: Normal breath sounds. No stridor. No wheezing, rhonchi or rales.  Abdominal:     General: Bowel sounds are normal.     Palpations: Abdomen is soft.  Musculoskeletal:     Cervical back: Normal range of motion.  Skin:    General: Skin is warm and dry.     Capillary Refill: Capillary refill takes less than 2  seconds.  Neurological:     General: No focal deficit present.     Mental Status: She is alert and oriented to person, place, and time.  Psychiatric:        Mood and Affect: Mood normal.        Behavior: Behavior normal.     ED Results / Procedures / Treatments   Labs (all labs ordered are listed, but only abnormal results are displayed) Labs Reviewed  RESP PANEL BY RT-PCR (RSV, FLU A&B, COVID)  RVPGX2 - Abnormal; Notable for the following components:      Result Value   Influenza A by PCR POSITIVE (*)    All other components within normal limits  GROUP A STREP BY PCR    EKG None  Radiology No results found.  Procedures Procedures   Medications Ordered in ED Medications  lidocaine (XYLOCAINE) 2 % viscous mouth solution 15 mL (15 mLs  Mouth/Throat Given 02/20/22 6761)    ED Course/ Medical Decision Making/ A&P                             Medical Decision Making Risk Prescription drug management.  Initial Impression and Ddx 37 year old female who presents to the emergency department with sore throat, cough.  On initial exam she is mildly ill-appearing but nonseptic and nontoxic in appearance.  She is hemodynamically stable.  Her lungs are clear bilaterally.  Oropharynx with mild erythema without tonsillar swelling. Patient PMH that increases complexity of ED encounter: None Differential: Viral upper respiratory illness, strep, PTA, RPA, Ludwig's angina, pneumonia, sinusitis, etc.  Interpretation of Diagnostics I independent reviewed and interpreted the labs as followed: Influenza positive  - I independently visualized the following imaging with scope of interpretation limited to determining acute life threatening conditions related to emergency care: Not indicated  Patient Reassessment and Ultimate Disposition/Management Overall well-appearing 37 year old female.  She is in no acute distress. Oral pharynx with mild oropharyngeal erythema without tonsillar swelling or exudates.  No evidence of PTA.  No hot potato voice or other findings concerning for RPA, Ludwig's angina.  She has no sinus pressure concerning for bacterial sinusitis. Triage ordered a strep test which was negative. Lungs are clear bilaterally, no hypoxia or difficulty breathing concerning for bacterial pneumonia.  Would not pursue chest x-ray at this time. Viral testing positive for influenza.  Consistent with symptoms. She is euvolemic on exam not requiring fluid resuscitation.  Will discharge with symptomatic treatment of symptoms.  Return precautions given for worsening difficulty breathing, shortness of breath and fever concerning for pneumonia.  She verbalized understanding.  The patient has been appropriately medically screened and/or stabilized  in the ED. I have low suspicion for any other emergent medical condition which would require further screening, evaluation or treatment in the ED or require inpatient management. At time of discharge the patient is hemodynamically stable and in no acute distress. I have discussed work-up results and diagnosis with patient and answered all questions. Patient is agreeable with discharge plan. We discussed strict return precautions for returning to the emergency department and they verbalized understanding.     Patient management required discussion with the following services or consulting groups:  None  Complexity of Problems Addressed Acute complicated illness or Injury  Additional Data Reviewed and Analyzed Further history obtained from: Past medical history and medications listed in the EMR, Prior ED visit notes, and Care Everywhere  Patient Encounter Risk Assessment None  Final Clinical  Impression(s) / ED Diagnoses Final diagnoses:  Influenza    Rx / DC Orders ED Discharge Orders     None         Mickie Hillier, PA-C 02/20/22 1015    Valarie Merino, MD 02/20/22 [phone]

## 2022-02-21 ENCOUNTER — Telehealth: Payer: Self-pay | Admitting: *Deleted

## 2022-02-21 NOTE — Patient Outreach (Signed)
  Care Coordination Operating Room Services Note Transition Care Management Unsuccessful Follow-up Telephone Call  Date of discharge and from where:  02/20/22 from [facility]  Attempts:  1st Attempt  Reason for unsuccessful TCM follow-up call:  No answer/busy   Lurena Joiner RN, [facility] RN Care Coordinator

## 2022-04-03 ENCOUNTER — Ambulatory Visit (INDEPENDENT_AMBULATORY_CARE_PROVIDER_SITE_OTHER): Payer: Medicaid Other

## 2022-04-03 ENCOUNTER — Other Ambulatory Visit: Payer: Self-pay

## 2022-04-03 VITALS — BP 136/87 | HR 85 | Wt 116.0 lb

## 2022-04-03 DIAGNOSIS — Z3042 Encounter for surveillance of injectable contraceptive: Secondary | ICD-10-CM

## 2022-04-03 DIAGNOSIS — J302 Other seasonal allergic rhinitis: Secondary | ICD-10-CM

## 2022-04-03 IMAGING — US US ABDOMEN LIMITED
1 series · 7 of 7 positions shown · non-contrast
Comparison: None.

CLINICAL DATA: Suspected umbilical hernia.

EXAM:
ULTRASOUND ABDOMEN LIMITED

[Series 1: us abdomen limited · 7 acquisitions, 7 frames shown]
[im 1/7]
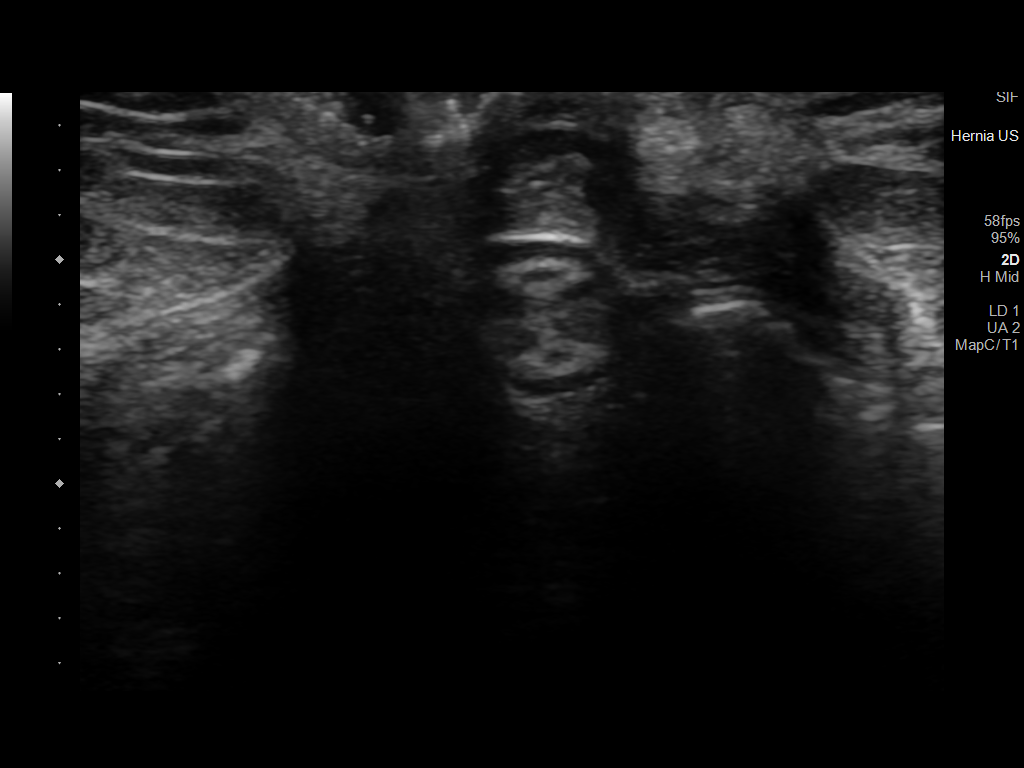
[im 2/7]
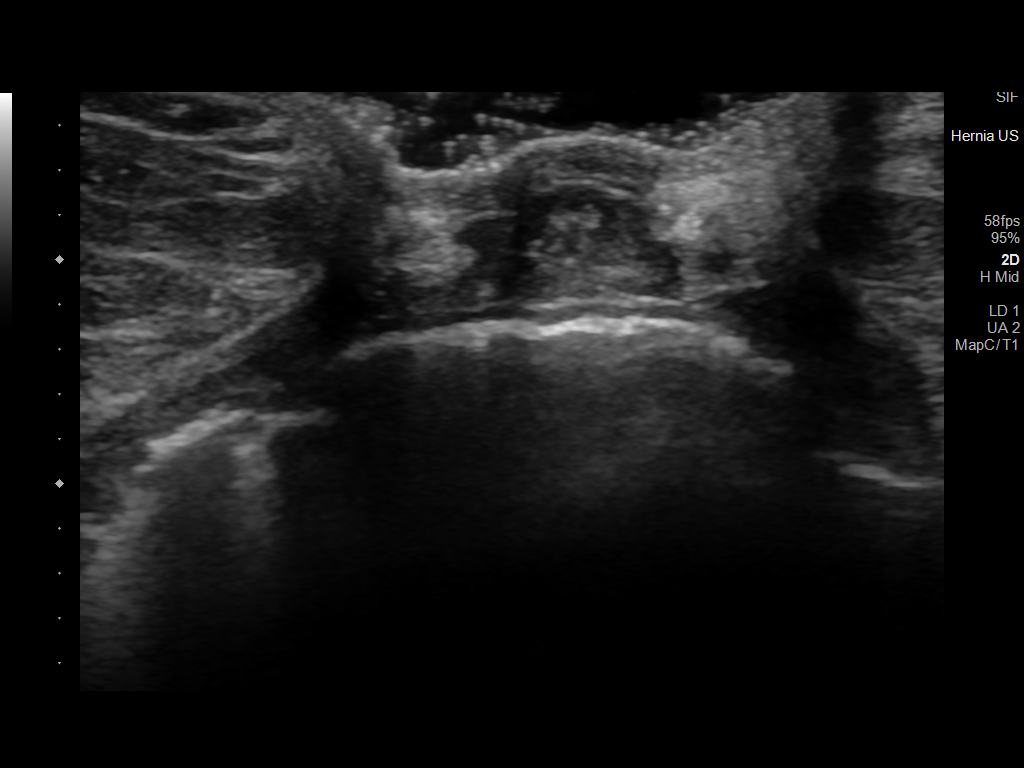
[im 3/7]
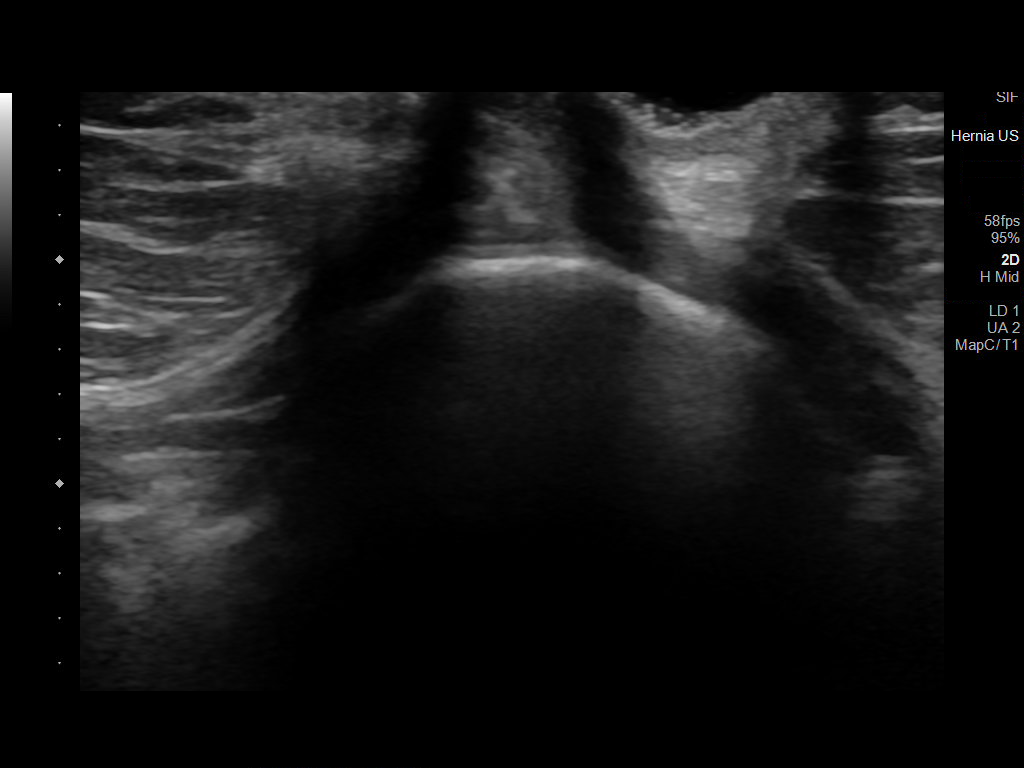
[im 4/7]
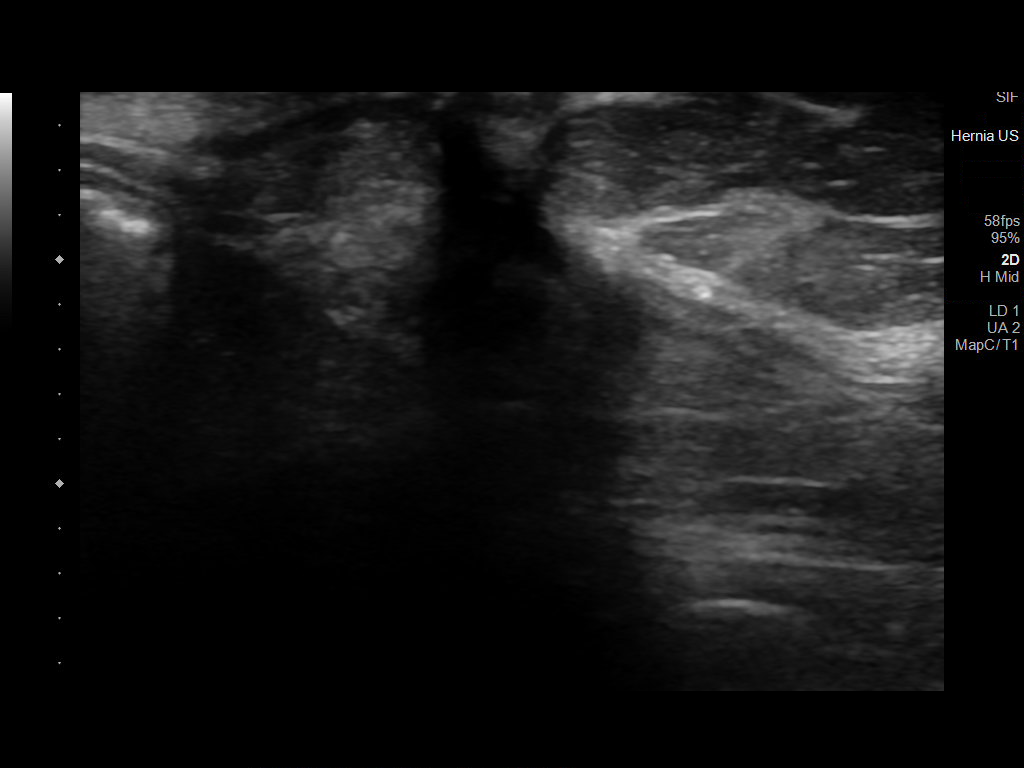
[im 5/7]
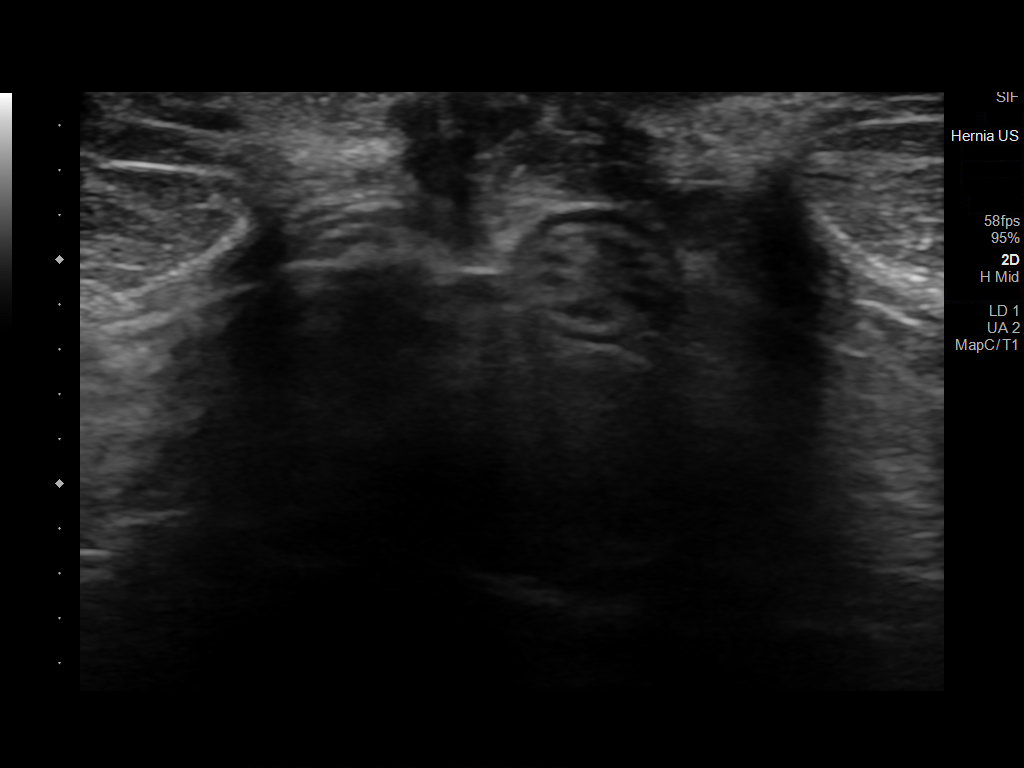
[im 6/7]
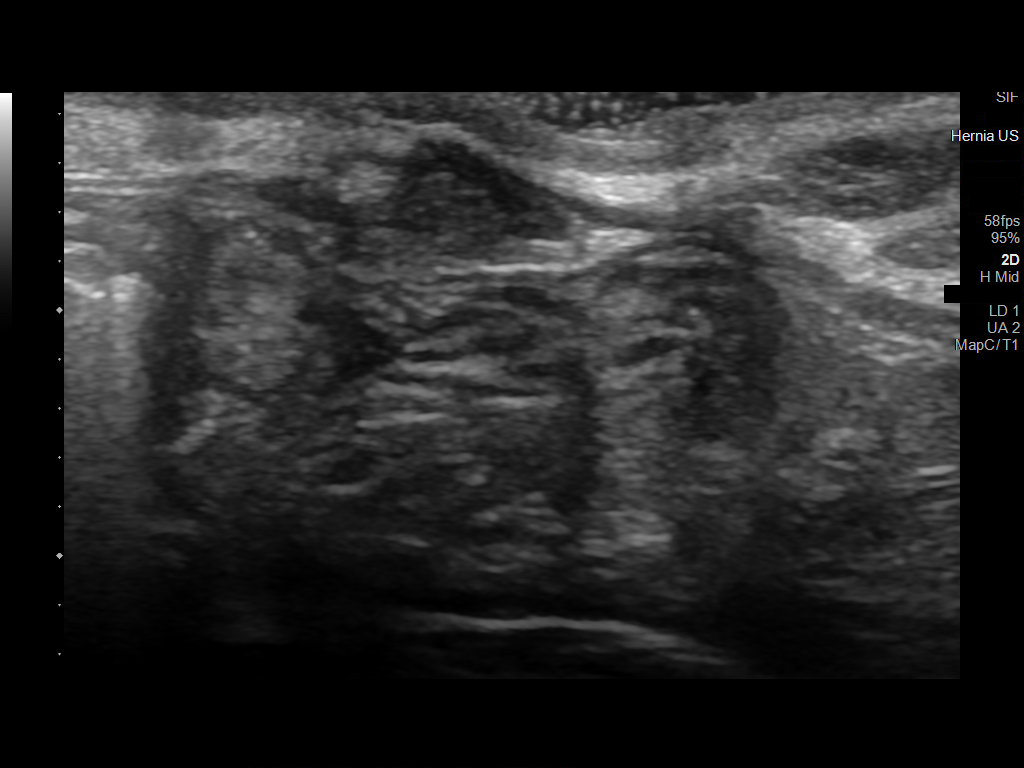
[im 7/7]
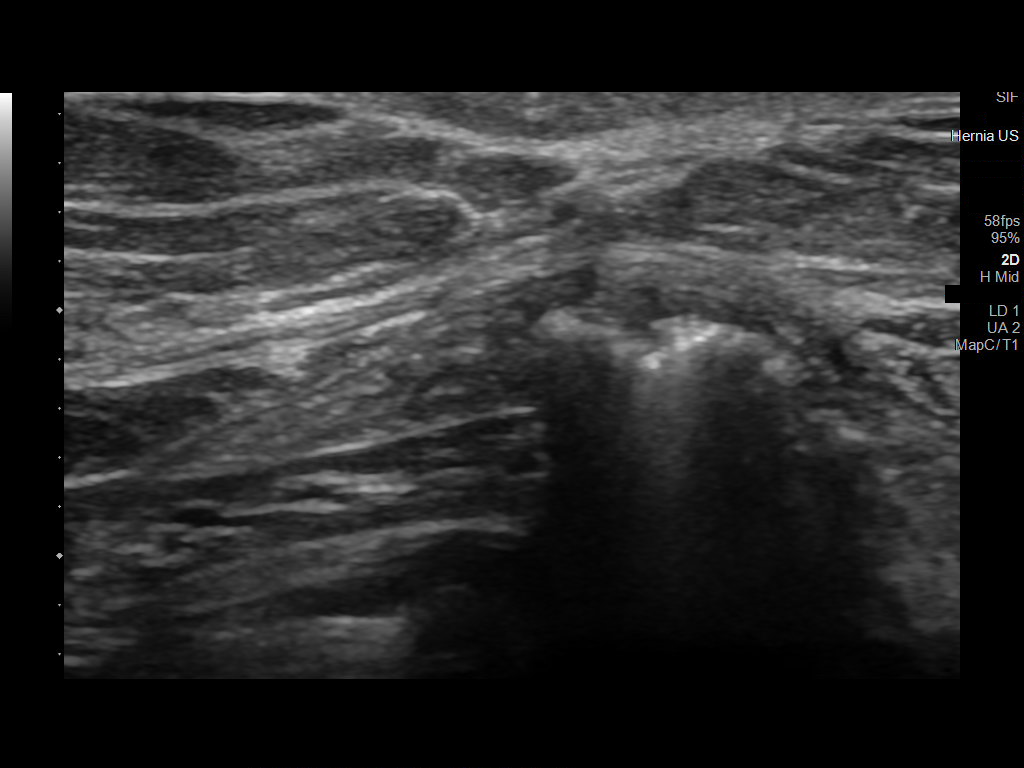

[7 of 7 positions shown; findings below may reference images not displayed]

FINDINGS: Normal ultrasonographic appearance of the soft tissues of the
umbilical and periumbilical region are noted, without evidence of a
soft tissue mass or cystic lesion. No hernia is identified.
IMPRESSION: Unremarkable ultrasonographic evaluation of the umbilical area.

## 2022-04-03 MED ORDER — CETIRIZINE HCL 10 MG PO CHEW: 10.0000 mg | CHEWABLE_TABLET | Freq: Every day | ORAL | 2 refills | Status: DC

## 2022-04-03 MED ORDER — MEDROXYPROGESTERONE ACETATE 150 MG/ML IM SUSY
150.0000 mg | PREFILLED_SYRINGE | Freq: Once | INTRAMUSCULAR | Status: AC
  Administered 2022-04-03: 150 mg via INTRAMUSCULAR

## 2022-04-03 NOTE — Progress Notes (Signed)
Tajai D Bitton here for Depo-Provera Injection. Injection administered without complication. Patient will return in 3 months for next injection between 06/19/22 and 07/03/22. Next annual visit due April 2024.   Patient requests refill of Zyrtec for seasonal allergies; unable to purchase OTC due to cost.  Annabell Howells, RN 04/03/2022  4:23 PM

## 2022-04-04 ENCOUNTER — Telehealth: Payer: Self-pay | Admitting: Family Medicine

## 2022-04-04 NOTE — Telephone Encounter (Signed)
Patient sate she need the liquid for Zyrtec state she can't take a pill

## 2022-04-05 NOTE — Telephone Encounter (Signed)
Talked with pharmacy and changed to liquid. Pt notified.

## 2022-06-17 ENCOUNTER — Other Ambulatory Visit: Payer: Self-pay | Admitting: Family Medicine

## 2022-06-19 ENCOUNTER — Ambulatory Visit (INDEPENDENT_AMBULATORY_CARE_PROVIDER_SITE_OTHER): Payer: Medicaid Other

## 2022-06-19 ENCOUNTER — Other Ambulatory Visit: Payer: Self-pay

## 2022-06-19 VITALS — BP 119/83 | HR 82 | Ht 64.5 in | Wt 123.5 lb

## 2022-06-19 DIAGNOSIS — J302 Other seasonal allergic rhinitis: Secondary | ICD-10-CM | POA: Diagnosis not present

## 2022-06-19 DIAGNOSIS — Z3042 Encounter for surveillance of injectable contraceptive: Secondary | ICD-10-CM | POA: Diagnosis not present

## 2022-06-19 MED ORDER — MEDROXYPROGESTERONE ACETATE 150 MG/ML IM SUSY
150.0000 mg | PREFILLED_SYRINGE | Freq: Once | INTRAMUSCULAR | Status: AC
  Administered 2022-06-19: 150 mg via INTRAMUSCULAR

## 2022-06-19 MED ORDER — CETIRIZINE HCL 10 MG PO CHEW: 10.0000 mg | CHEWABLE_TABLET | Freq: Every day | ORAL | 2 refills | Status: DC

## 2022-06-19 NOTE — Progress Notes (Signed)
Meghan Frederick here for Depo-Provera Injection. Injection administered without complication. Patient will return in 3 months for next injection between Aug 7 and Aug 21. Next annual visit due now.  Pt advised to schedule annual with pap with front office.    Ralene Bathe, RN 06/19/2022  3:18 PM

## 2022-06-26 ENCOUNTER — Other Ambulatory Visit: Payer: Self-pay

## 2022-06-26 MED ORDER — CETIRIZINE HCL 5 MG/5ML PO SOLN: 5.0000 mg | Freq: Every day | ORAL | 0 refills | Status: DC

## 2022-07-19 ENCOUNTER — Other Ambulatory Visit (HOSPITAL_COMMUNITY)
Admission: RE | Admit: 2022-07-19 | Discharge: 2022-07-19 | Disposition: A | Discharge status: Home / Self Care | Payer: Medicaid Other | Source: Ambulatory Visit | Attending: Family Medicine | Admitting: Family Medicine

## 2022-07-19 ENCOUNTER — Encounter: Payer: Self-pay | Admitting: Family Medicine

## 2022-07-19 ENCOUNTER — Ambulatory Visit (INDEPENDENT_AMBULATORY_CARE_PROVIDER_SITE_OTHER): Payer: Medicaid Other | Admitting: Family Medicine

## 2022-07-19 VITALS — BP 116/81 | HR 93 | Wt 119.4 lb

## 2022-07-19 DIAGNOSIS — Z01419 Encounter for gynecological examination (general) (routine) without abnormal findings: Secondary | ICD-10-CM

## 2022-07-19 DIAGNOSIS — Z113 Encounter for screening for infections with a predominantly sexual mode of transmission: Secondary | ICD-10-CM | POA: Diagnosis not present

## 2022-07-19 DIAGNOSIS — Z13220 Encounter for screening for lipoid disorders: Secondary | ICD-10-CM

## 2022-07-19 NOTE — Patient Instructions (Addendum)
It was great to meet you!  Things we discussed at today's visit: - We did routine testing for sexually transmitted infections. I will send you a MyChart message with the results. - We are also checking your cholesterol. - You are due for a pap in December 2025  Things to do to keep yourself healthy  - Exercise at least 30-45 minutes a day, 3-4 days a week.  - Eat a diet with lots of fruits and vegetables (5 servings per day). - Avoid sugar sweetened beverages and processed foods whenever possible. - Seatbelts can save your life. Wear them always.  - Safe sex - use a condom to prevent STIs. - Alcohol -  If you drink, do it moderately, less than 2 drinks per day. - Sleep - aim for 7-8 hours nightly. - Limit screen time to no more than 2 hours per day.  - Health Care Power of Attorney. Choose someone to make decisions for you if you are not able.  - Depression is common in our stressful world. If you're feeling down or losing interest in things you normally enjoy, please come in for a visit.  - Violence - If anyone is threatening or hurting you, please call immediately.   Take care and seek immediate care sooner if you develop any concerns.  Dr. [facility]

## 2022-07-19 NOTE — Progress Notes (Signed)
    SUBJECTIVE:   Chief complaint/HPI: annual examination  Meghan Frederick is a 37 y.o. who presents today for an annual exam.   Review of systems form notable for: no advance directives.   Updated history tabs and problem list.   OBJECTIVE:   BP 116/81 (BP Location: Left Arm, Patient Position: Sitting, Cuff Size: Normal)   Pulse 93   Wt 119 lb 6.4 oz (54.2 kg)   SpO2 99%   BMI 20.18 kg/m   Gen: NAD, pleasant, able to participate in exam HEENT: [place]/AT, PERRLA, nares patent bilaterally, TM normal bilaterally, oropharynx unremarkable Neck: supple, no cervical or supraclavicular lymphadenopathy, thyroid smooth and normal in size CV: RRR, normal S1/S2, no murmur Resp: Normal effort, lungs CTAB GI: abdomen soft, non-tender, non-distended Extremities: no edema or cyanosis Skin: warm and dry, no rashes noted Neuro: alert, no obvious focal deficits Psych: Normal affect and mood GU/GYN: Exam performed in the presence of a chaperone. External genitalia within normal limits.  Vaginal mucosa pink, moist, normal rugae.  Nonfriable cervix without lesions, no discharge or bleeding noted on speculum exam.      ASSESSMENT/PLAN:   Annual Examination  See AVS for age appropriate recommendations.   PHQ score 1, reviewed and discussed. Blood pressure reviewed and at goal .  Asked about intimate partner violence and patient reports no concerns.  The patient currently uses abstinence and depo for contraception. Advanced directives- discussed. Advised patient to designate decision maker & communicate her wishes.   Considered the following items based upon USPSTF recommendations: HIV testing: ordered Hepatitis C: normal last year, repeat not indicated Hepatitis B: negative last year, repeat not indicated Syphilis: ordered GC/CT  ordered Lipid panel (nonfasting or fasting) discussed based upon AHA recommendations and ordered.  Consider repeat every 4-6 years.  Reviewed risk factors for latent  tuberculosis and not indicated  Discussed family history, BRCA testing not indicated.  Cervical cancer screening: prior Pap reviewed, repeat due in December 2025 Immunizations: UTD  Follow up in 1 year or sooner if indicated.    Maury Dus, MD [facility]

## 2022-07-20 LAB — SPECIMEN STATUS

## 2022-07-23 LAB — CERVICOVAGINAL ANCILLARY ONLY
Chlamydia: NEGATIVE
Comment: NEGATIVE
Comment: NEGATIVE
Comment: NORMAL
Neisseria Gonorrhea: NEGATIVE
Trichomonas: NEGATIVE

## 2022-07-23 LAB — LIPID PANEL
Chol/HDL Ratio: 2.5 ratio (ref 0.0–4.4)
Cholesterol, Total: 128 mg/dL (ref 100–199)
HDL: 52 mg/dL (ref >39)
LDL Chol Calc (NIH): 60 mg/dL (ref 0–99)
Triglycerides: 84 mg/dL (ref 0–149)
VLDL Cholesterol Cal: 16 mg/dL (ref 5–40)

## 2022-07-23 LAB — RPR: RPR Ser Ql: NONREACTIVE

## 2022-07-23 LAB — HIV ANTIBODY (ROUTINE TESTING W REFLEX): HIV Screen 4th Generation wRfx: NONREACTIVE

## 2022-08-19 ENCOUNTER — Encounter: Payer: Self-pay | Admitting: General Practice

## 2022-08-21 ENCOUNTER — Encounter: Payer: Self-pay | Admitting: General Practice

## 2022-08-28 NOTE — Progress Notes (Deleted)
GYNECOLOGY ANNUAL PREVENTATIVE CARE ENCOUNTER NOTE  History:     Meghan Frederick is a 37 y.o. G14P1001 female here for a routine annual gynecologic exam.  Current complaints: ***.   Denies abnormal vaginal bleeding, discharge, pelvic pain, problems with intercourse or other gynecologic concerns.    Gynecologic History No LMP recorded. Patient has had an injection. Contraception: {method:5051} Last Pap: ***. Result was {norm/abn:16337} with negative HPV Last Mammogram: ***.  Result was {norm/abn:16337} Last Colonoscopy: ***.  Result was {norm/abn:16337}  Obstetric History OB History  Gravida Para Term Preterm AB Living  1 1 1     1   SAB IAB Ectopic Multiple Live Births        0 1    # Outcome Date GA Lbr Len/2nd Weight Sex Type Anes PTL Lv  1 Term 03/15/17 [redacted]w[redacted]d 05:25 / 03:01 6 lb 12.8 oz (3.084 kg) F Vag-Spont EPI  LIV     Birth Comments: none    Past Medical History:  Diagnosis Date   Medical history non-contributory     Past Surgical History:  Procedure Laterality Date   I & D EXTREMITY Right    Middle finger   UMBILICAL HERNIA REPAIR  03/14/2021    Current Outpatient Medications on File Prior to Visit  Medication Sig Dispense Refill   cetirizine HCl (ZYRTEC) 5 MG/5ML SOLN Take 5 mLs (5 mg total) by mouth daily. 300 mL 0   Current Facility-Administered Medications on File Prior to Visit  Medication Dose Route Frequency Provider Last Rate Last Admin   medroxyPROGESTERone (DEPO-PROVERA) injection 150 mg  150 mg Intramuscular Q90 days Carney Living, MD   150 mg at 08/11/19 1500    Allergies  Allergen Reactions   Bee Pollen Itching   Dust Mite Extract Itching and Other (See Comments)    Runny nose, sore throat Other reaction(s): Other (See Comments) Runny nose, sore throat   Pollen Extract Itching   Other Rash    Green peppers mushrooms    Social History:  reports that she quit smoking about 13 years ago. Her smoking use included cigarettes. She has  never used smokeless tobacco. She reports current alcohol use. She reports that she does not use drugs.  Family History  Problem Relation Age of Onset   Cancer Mother        lung   Hypertension Father    Cancer Father        prostate    The following portions of the patient's history were reviewed and updated as appropriate: allergies, current medications, past family history, past medical history, past social history, past surgical history and problem list.  Review of Systems Pertinent items noted in HPI and remainder of comprehensive ROS otherwise negative.  Physical Exam:  There were no vitals taken for this visit. CONSTITUTIONAL: Well-developed, well-nourished female in no acute distress.  HENT:  Normocephalic, atraumatic, External right and left ear normal.  EYES: Conjunctivae and EOM are normal. Pupils are equal, round, and reactive to light. No scleral icterus.  NECK: Normal range of motion, supple, no masses.  Normal thyroid.  SKIN: Skin is warm and dry. No rash noted. Not diaphoretic. No erythema. No pallor. MUSCULOSKELETAL: Normal range of motion. No tenderness.  No cyanosis, clubbing, or edema. NEUROLOGIC: Alert and oriented to person, place, and time. Normal reflexes, muscle tone coordination.  PSYCHIATRIC: Normal mood and affect. Normal behavior. Normal judgment and thought content. CARDIOVASCULAR: Normal heart rate noted, regular rhythm RESPIRATORY: Clear to auscultation bilaterally. Effort  and breath sounds normal, no problems with respiration noted. BREASTS: Symmetric in size. No masses, tenderness, skin changes, nipple drainage, or lymphadenopathy bilaterally. Performed in the presence of a chaperone. ABDOMEN: Soft, no distention noted.  No tenderness, rebound or guarding.  PELVIC: Normal appearing external genitalia and urethral meatus; normal appearing vaginal mucosa and cervix.  No abnormal vaginal discharge noted.  Pap smear obtained.  Normal uterine size, no other  palpable masses, no uterine or adnexal tenderness.  Performed in the presence of a chaperone.   Assessment and Plan:    There are no diagnoses linked to this encounter. Will follow up results of pap smear and manage accordingly. Mammogram scheduled Colon cancer screening is up to date***Discussed need for colon cancer screening over the age 38. Colonoscopy recommended as this is the gold standard. Referral made to Gastroenterology for colonoscopy*** Patient declined colonoscopy, so discussed Cologuard.   Emphasized that positive Cologuard tests will need to be follow up with diagnostic colonoscopy. Cologuard ordered.***Patient will decide and let us know her decision. Routine preventative health maintenance measures emphasized. Please refer to After Visit Summary for other counseling recommendations.      Lavonda Jumbo, DO OB Fellow, Faculty [facility], Center for [facility] 08/28/2022, 3:16 PM

## 2022-08-29 ENCOUNTER — Ambulatory Visit: Payer: Medicaid Other | Admitting: Family Medicine

## 2022-09-06 ENCOUNTER — Ambulatory Visit: Payer: Medicaid Other

## 2022-09-12 ENCOUNTER — Ambulatory Visit (INDEPENDENT_AMBULATORY_CARE_PROVIDER_SITE_OTHER): Payer: Medicaid Other

## 2022-09-12 DIAGNOSIS — Z3042 Encounter for surveillance of injectable contraceptive: Secondary | ICD-10-CM | POA: Diagnosis present

## 2022-09-12 MED ORDER — MEDROXYPROGESTERONE ACETATE 150 MG/ML IM SUSY
150.0000 mg | PREFILLED_SYRINGE | Freq: Once | INTRAMUSCULAR | Status: AC
  Administered 2022-09-12: 150 mg via INTRAMUSCULAR

## 2022-09-12 NOTE — Progress Notes (Signed)
Patient here today for Depo Provera injection and is within her dates.    Patient followed by GYN for Depo injections. She was unable to get an apt within her scheduled dates.  Depo given in [facility]OQ today.  Site unremarkable & patient tolerated injection.    Next injection due 11/28/2019-12/12/2022.  Reminder card given.    Patient reports she will continue to follow GYN for Depo injections.

## 2022-09-28 ENCOUNTER — Emergency Department (HOSPITAL_COMMUNITY)
Admission: EM | Admit: 2022-09-28 | Discharge: 2022-09-29 | Disposition: A | Discharge status: Home / Self Care | Payer: No Typology Code available for payment source | Attending: Emergency Medicine | Admitting: Emergency Medicine

## 2022-09-28 ENCOUNTER — Other Ambulatory Visit: Payer: Self-pay

## 2022-09-28 DIAGNOSIS — Y92410 Unspecified street and highway as the place of occurrence of the external cause: Secondary | ICD-10-CM | POA: Diagnosis not present

## 2022-09-28 DIAGNOSIS — S8992XA Unspecified injury of left lower leg, initial encounter: Secondary | ICD-10-CM | POA: Diagnosis present

## 2022-09-28 DIAGNOSIS — S8002XA Contusion of left knee, initial encounter: Secondary | ICD-10-CM | POA: Diagnosis not present

## 2022-09-28 DIAGNOSIS — M25552 Pain in left hip: Secondary | ICD-10-CM | POA: Diagnosis not present
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
## 2022-09-29 ENCOUNTER — Encounter (HOSPITAL_COMMUNITY): Payer: Self-pay | Admitting: *Deleted

## 2022-09-29 ENCOUNTER — Emergency Department (HOSPITAL_COMMUNITY): Payer: No Typology Code available for payment source

## 2022-09-29 LAB — PREGNANCY, URINE: Preg Test, Ur: NEGATIVE

## 2022-09-29 MED ORDER — CYCLOBENZAPRINE HCL 5 MG PO TABS: 10.0000 mg | ORAL_TABLET | Freq: Two times a day (BID) | ORAL | 0 refills | Status: DC | PRN

## 2022-09-29 MED ORDER — KETOROLAC TROMETHAMINE 30 MG/ML IJ SOLN
30.0000 mg | Freq: Once | INTRAMUSCULAR | Status: AC
  Administered 2022-09-29: 30 mg via INTRAMUSCULAR
  Filled 2022-09-29: qty 1

## 2022-09-29 MED ORDER — IBUPROFEN 600 MG PO TABS: 600.0000 mg | ORAL_TABLET | Freq: Four times a day (QID) | ORAL | 0 refills | Status: DC | PRN

## 2022-09-29 NOTE — ED Notes (Signed)
Patient sleeping

## 2022-09-29 NOTE — ED Notes (Signed)
Patient is alert oriented ambulatory
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
## 2022-11-28 ENCOUNTER — Other Ambulatory Visit: Payer: Self-pay | Admitting: Obstetrics and Gynecology

## 2022-12-20 ENCOUNTER — Ambulatory Visit (INDEPENDENT_AMBULATORY_CARE_PROVIDER_SITE_OTHER): Payer: Medicaid Other

## 2022-12-20 DIAGNOSIS — Z3042 Encounter for surveillance of injectable contraceptive: Secondary | ICD-10-CM | POA: Diagnosis present

## 2022-12-20 LAB — POCT URINE PREGNANCY: Preg Test, Ur: NEGATIVE

## 2022-12-20 MED ORDER — MEDROXYPROGESTERONE ACETATE 150 MG/ML IM SUSP
150.0000 mg | Freq: Once | INTRAMUSCULAR | Status: AC
Start: 2022-12-20 — End: 2022-12-20
  Administered 2022-12-20: 150 mg via INTRAMUSCULAR

## 2022-12-20 NOTE — Progress Notes (Signed)
Patient presents for depo provera injection and is not within her dates. Urine pregnancy obtained and negative.   Patient was following GYN for depo injections, however prefers to get them here. Last physical here was with PCP on 07/19/2022.  Depo provera given RUOQ without complication.   Patient to return for next depo injection 03/07/2023-03/21/2023.  Patient to use backup birth control for one week.  Reminder card given.

## 2023-03-28 ENCOUNTER — Ambulatory Visit: Payer: Medicaid Other

## 2023-03-28 DIAGNOSIS — Z3042 Encounter for surveillance of injectable contraceptive: Secondary | ICD-10-CM | POA: Diagnosis present

## 2023-03-28 LAB — POCT URINE PREGNANCY: Preg Test, Ur: NEGATIVE

## 2023-03-28 MED ORDER — MEDROXYPROGESTERONE ACETATE 150 MG/ML IM SUSP
150.0000 mg | Freq: Once | INTRAMUSCULAR | Status: AC
  Administered 2023-03-28: 150 mg via INTRAMUSCULAR

## 2023-03-28 NOTE — Progress Notes (Signed)
 Patient here today for Depo Provera injection and is not within her dates. Urine pregnancy obtained and was negative. She denies sexual activity within the last month. Advised to use condoms for the next seven days after depo administration.   Last contraceptive appt was 07/19/22  Depo given in LUOQ today.  Site unremarkable & patient tolerated injection.    Next injection due 06/13/23-06/27/23.  Reminder card given.    Veronda Prude, RN

## 2023-06-20 ENCOUNTER — Ambulatory Visit (INDEPENDENT_AMBULATORY_CARE_PROVIDER_SITE_OTHER)

## 2023-06-20 ENCOUNTER — Other Ambulatory Visit: Payer: Self-pay

## 2023-06-20 DIAGNOSIS — Z30019 Encounter for initial prescription of contraceptives, unspecified: Secondary | ICD-10-CM | POA: Diagnosis present

## 2023-06-20 MED ORDER — MEDROXYPROGESTERONE ACETATE 150 MG/ML IM SUSP
150.0000 mg | Freq: Once | INTRAMUSCULAR | Status: AC
  Administered 2023-06-20: 150 mg via INTRAMUSCULAR

## 2023-06-20 MED ORDER — CETIRIZINE HCL 5 MG/5ML PO SOLN: 5.0000 mg | Freq: Every day | ORAL | 0 refills | Status: AC

## 2023-06-20 NOTE — Progress Notes (Signed)
 Patient here today for Depo Provera  injection and is within her dates.    Last contraceptive appt was 07/19/2022.  Depo given in RUOQ today.  Site unremarkable & patient tolerated injection.    Next injection due 09/05/2023-09/19/2023.  Reminder card given.    Please schedule her next injection with a provider as her prescription will expire in June.

## 2023-09-12 ENCOUNTER — Ambulatory Visit (INDEPENDENT_AMBULATORY_CARE_PROVIDER_SITE_OTHER)

## 2023-09-12 DIAGNOSIS — Z30019 Encounter for initial prescription of contraceptives, unspecified: Secondary | ICD-10-CM | POA: Diagnosis present

## 2023-09-12 MED ORDER — MEDROXYPROGESTERONE ACETATE 150 MG/ML IM SUSP
150.0000 mg | Freq: Once | INTRAMUSCULAR | Status: AC
Start: 2023-09-12 — End: 2023-09-12
  Administered 2023-09-12: 150 mg via INTRAMUSCULAR

## 2023-09-12 NOTE — Progress Notes (Signed)
 Patient here today for Depo Provera  injection and is within her dates.    Last contraceptive appt was 07/19/2022.  Depo given in LUOQ today.  Site unremarkable & patient tolerated injection.    Next injection due 11/28/2023-12/12/2023.  Reminder card given.    Patient advised her depo prescription has expired. Advised she must have an apt with PCP for prescription renewal.   Advised to schedule apt with PCP within next depo dates.

## 2023-11-03 ENCOUNTER — Ambulatory Visit (INDEPENDENT_AMBULATORY_CARE_PROVIDER_SITE_OTHER): Admitting: Family Medicine

## 2023-11-03 ENCOUNTER — Encounter: Payer: Self-pay | Admitting: Family Medicine

## 2023-11-03 VITALS — BP 124/88 | HR 80 | Ht 64.5 in | Wt 111.2 lb

## 2023-11-03 DIAGNOSIS — Z0000 Encounter for general adult medical examination without abnormal findings: Secondary | ICD-10-CM | POA: Diagnosis not present

## 2023-11-03 NOTE — Progress Notes (Signed)
    SUBJECTIVE:   Chief compliant/HPI: annual examination  Meghan Frederick is a 38 y.o. who presents today for an annual exam.   Review of systems form notable for negative.   Updated history tabs and problem list .   OBJECTIVE:   BP 124/88   Pulse 80   Ht 5' 4.5 (1.638 m)   Wt 111 lb 3.2 oz (50.4 kg)   SpO2 100%   BMI 18.79 kg/m   General: A&O, NAD HEENT: No sign of trauma, EOM grossly intact Cardiac: RRR, no m/r/g Respiratory: CTAB, normal WOB, no w/c/r GI: Soft, NTTP, non-distended  Extremities: NTTP, no peripheral edema. Neuro: Normal gait, moves all four extremities appropriately. Psych: Appropriate mood and affect   ASSESSMENT/PLAN:   Assessment & Plan Encounter for well woman exam without gynecological exam  Annual Examination  See AVS for age appropriate recommendations.   PHQ score 0, reviewed and discussed. Blood pressure reviewed and at goal .  The patient currently uses Depo for contraception- on and off for teenager. Folate recommended as appropriate, minimum of 400 mcg per day. Discussed risk of osteoporosis and bone mineral density, she is aware and wants to continue Depo.    Considered the following items based upon USPSTF recommendations: HIV testing:recently completed and result reviewed, normal  Hepatitis C: recently completed and result reviewed, normal  Hepatitis B:recently completed and result reviewed, normal  Syphilis if at high risk: discussed and declined GC/CT not at high risk and not ordered. Lipid panel (nonfasting or fasting) discussed based upon AHA recommendations and recently completed and repeat not yet indicated.  Consider repeat every 4-6 years.   Discussed family history, BRCA testing not indicated.  Cervical cancer screening: prior Pap reviewed, repeat due in 12/2023 Immunizations declined HPV and flu vaccine  MyChart Activation:Already signed up   Follow up in 2 months for repeat pap smear or sooner if indicated.     Rollene FORBES Keeling, MD [facility]

## 2023-11-03 NOTE — Patient Instructions (Addendum)
 It was wonderful to see you today.  Please bring ALL of your medications with you to every visit.   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 But you can go to: [facility] at [facility] [address], [address] [place], [place] [address]  Thank you for choosing [facility].   Please call [phone] with any questions about today's appointment.  Please arrive at least 15 minutes prior to your scheduled appointments.   If you had blood work today, I will send you a MyChart message or a letter if results are normal. Otherwise, I will give you a call.   If you had a referral placed, they will call you to set up an appointment. Please give us  a call if you don't hear back in the next 2 weeks.   If you need additional refills before your next appointment, please call your pharmacy first.  Don't forget to check out the [facility] in the [facility] at [address] [phone] Affordable prices on prescriptions and over-the-counter items, as well as services like vaccinations and medication home delivery.   Rollene Keeling, MD  Family Medicine

## 2023-12-10 ENCOUNTER — Other Ambulatory Visit: Payer: Self-pay

## 2023-12-10 ENCOUNTER — Emergency Department (HOSPITAL_COMMUNITY)
Admission: EM | Admit: 2023-12-10 | Discharge: 2023-12-10 | Disposition: A | Discharge status: Home / Self Care | Attending: Emergency Medicine | Admitting: Emergency Medicine

## 2023-12-10 DIAGNOSIS — H9201 Otalgia, right ear: Secondary | ICD-10-CM | POA: Diagnosis not present

## 2023-12-10 MED ORDER — CIPROFLOXACIN-DEXAMETHASONE 0.3-0.1 % OT SUSP: 4.0000 [drp] | Freq: Two times a day (BID) | OTIC | 0 refills | Status: DC

## 2023-12-10 NOTE — ED Triage Notes (Signed)
 Patient states a piece of cotton is stuck inside her right ear this morning . No visible bleeding /no hearing loss.

## 2023-12-10 NOTE — ED Provider Notes (Signed)
  [place] EMERGENCY DEPARTMENT AT [facility] Provider Note   CSN: [id number] Arrival date & time: 12/10/23  9587     Patient presents with: No chief complaint on file.   Meghan Frederick is a 38 y.o. female.   38 year old female presents with complaint of foreign body in the right ear canal.  Patient states that she was cleaning her ear with a Q-tip when the cotton fell off and was stuck in her ear.  She irrigated the ear at home with water and presents with concern for retained foreign body       Prior to Admission medications   Medication Sig Start Date End Date Taking? Authorizing Provider  ciprofloxacin -dexamethasone (CIPRODEX) OTIC suspension Place 4 drops into the right ear 2 (two) times daily. 12/10/23  Yes Beverley Leita LABOR, PA-C  cetirizine  HCl (ZYRTEC ) 5 MG/5ML SOLN Take 5 mLs (5 mg total) by mouth daily. 06/20/23   Donzetta Rollene BRAVO, MD  cyclobenzaprine  (FLEXERIL ) 5 MG tablet Take 2 tablets (10 mg total) by mouth 2 (two) times daily as needed for muscle spasms. 09/29/22   Horton, Charmaine FALCON, MD  ibuprofen  (ADVIL ) 600 MG tablet Take 1 tablet (600 mg total) by mouth every 6 (six) hours as needed. 09/29/22   Horton, Charmaine FALCON, MD    Allergies: Bee pollen, Dust mite extract, Pollen extract, and Other    Review of Systems Negative except as per HPI Updated Vital Signs There were no vitals taken for this visit.  Physical Exam Vitals and nursing note reviewed.  Constitutional:      General: She is not in acute distress.    Appearance: She is well-developed. She is not diaphoretic.  HENT:     Head: Normocephalic and atraumatic.     Right Ear: Tympanic membrane and ear canal normal.     Left Ear: Tympanic membrane and ear canal normal.  Pulmonary:     Effort: Pulmonary effort is normal.  Neurological:     Mental Status: She is alert and oriented to person, place, and time.  Psychiatric:        Behavior: Behavior normal.     (all labs ordered are listed, but  only abnormal results are displayed) Labs Reviewed - No data to display  EKG: None  Radiology: No results found.   Procedures   Medications Ordered in the ED - No data to display                                  Medical Decision Making Risk Prescription drug management.   38 year old female with concern for foreign body in the right ear canal.  Exam reveals no foreign body.  She does have some irritation in her canal likely secondary to home attempts to remove the cotton.  Provided with prescription for Ciprodex drops to use should she develop pain with recommendation to recheck if pain is not improving with drops.     Final diagnoses:  Ear pain, right    ED Discharge Orders          Ordered    ciprofloxacin -dexamethasone (CIPRODEX) OTIC suspension  2 times daily        12/10/23 0425               Beverley Leita LABOR, PA-C 12/10/23 0426    Haze Lonni PARAS, MD 12/11/23 [phone]

## 2023-12-10 NOTE — Discharge Instructions (Addendum)
 Recheck with your doctor as needed. If you develop ear pain, please use drops as prescribed.  If pain is not improving, recheck with primary care provider.

## 2023-12-11 ENCOUNTER — Other Ambulatory Visit: Payer: Self-pay

## 2023-12-11 ENCOUNTER — Emergency Department (HOSPITAL_COMMUNITY)

## 2023-12-11 ENCOUNTER — Emergency Department (HOSPITAL_COMMUNITY)
Admission: EM | Admit: 2023-12-11 | Discharge: 2023-12-11 | Disposition: A | Discharge status: Home / Self Care | Attending: Emergency Medicine | Admitting: Emergency Medicine

## 2023-12-11 DIAGNOSIS — M4802 Spinal stenosis, cervical region: Secondary | ICD-10-CM | POA: Diagnosis not present

## 2023-12-11 DIAGNOSIS — Y92410 Unspecified street and highway as the place of occurrence of the external cause: Secondary | ICD-10-CM | POA: Insufficient documentation

## 2023-12-11 DIAGNOSIS — S299XXA Unspecified injury of thorax, initial encounter: Secondary | ICD-10-CM | POA: Diagnosis not present

## 2023-12-11 DIAGNOSIS — M542 Cervicalgia: Secondary | ICD-10-CM | POA: Diagnosis present

## 2023-12-11 DIAGNOSIS — S0990XA Unspecified injury of head, initial encounter: Secondary | ICD-10-CM | POA: Diagnosis not present

## 2023-12-11 DIAGNOSIS — S199XXA Unspecified injury of neck, initial encounter: Secondary | ICD-10-CM | POA: Diagnosis not present

## 2023-12-11 DIAGNOSIS — M549 Dorsalgia, unspecified: Secondary | ICD-10-CM | POA: Diagnosis not present

## 2023-12-11 DIAGNOSIS — S161XXA Strain of muscle, fascia and tendon at neck level, initial encounter: Secondary | ICD-10-CM

## 2023-12-11 DIAGNOSIS — S29012A Strain of muscle and tendon of back wall of thorax, initial encounter: Secondary | ICD-10-CM | POA: Diagnosis not present

## 2023-12-11 DIAGNOSIS — S29019A Strain of muscle and tendon of unspecified wall of thorax, initial encounter: Secondary | ICD-10-CM

## 2023-12-11 DIAGNOSIS — M47812 Spondylosis without myelopathy or radiculopathy, cervical region: Secondary | ICD-10-CM | POA: Diagnosis not present

## 2023-12-11 MED ORDER — METHOCARBAMOL 500 MG PO TABS
500.0000 mg | ORAL_TABLET | Freq: Once | ORAL | Status: AC
  Administered 2023-12-11: 500 mg via ORAL
  Filled 2023-12-11: qty 1

## 2023-12-11 MED ORDER — OXYCODONE HCL 5 MG PO TABS
5.0000 mg | ORAL_TABLET | Freq: Once | ORAL | Status: AC
  Administered 2023-12-11: 5 mg via ORAL
  Filled 2023-12-11: qty 1

## 2023-12-11 MED ORDER — METHOCARBAMOL 500 MG PO TABS: 500.0000 mg | ORAL_TABLET | Freq: Three times a day (TID) | ORAL | 0 refills | Status: DC | PRN
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
## 2023-12-17 ENCOUNTER — Ambulatory Visit

## 2023-12-17 DIAGNOSIS — Z3042 Encounter for surveillance of injectable contraceptive: Secondary | ICD-10-CM | POA: Diagnosis present

## 2023-12-17 LAB — POCT URINE PREGNANCY: Preg Test, Ur: NEGATIVE

## 2023-12-17 MED ORDER — MEDROXYPROGESTERONE ACETATE 150 MG/ML IM SUSP
150.0000 mg | Freq: Once | INTRAMUSCULAR | Status: AC
  Administered 2023-12-17: 150 mg via INTRAMUSCULAR
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
## 2023-12-19 ENCOUNTER — Ambulatory Visit: Admitting: Family Medicine

## 2023-12-22 ENCOUNTER — Ambulatory Visit (INDEPENDENT_AMBULATORY_CARE_PROVIDER_SITE_OTHER): Admitting: Family Medicine

## 2023-12-22 ENCOUNTER — Encounter: Payer: Self-pay | Admitting: Family Medicine

## 2023-12-22 VITALS — BP 120/84 | HR 98 | Ht 64.5 in | Wt 108.8 lb

## 2023-12-22 DIAGNOSIS — M542 Cervicalgia: Secondary | ICD-10-CM | POA: Diagnosis not present

## 2023-12-22 DIAGNOSIS — S29012A Strain of muscle and tendon of back wall of thorax, initial encounter: Secondary | ICD-10-CM

## 2023-12-22 MED ORDER — IBUPROFEN 600 MG PO TABS
600.0000 mg | ORAL_TABLET | Freq: Four times a day (QID) | ORAL | 0 refills | Status: AC | PRN
Start: 2023-12-22 — End: ?

## 2023-12-22 MED ORDER — METHOCARBAMOL 500 MG PO TABS: 500.0000 mg | ORAL_TABLET | Freq: Three times a day (TID) | ORAL | 0 refills | Status: AC | PRN

## 2023-12-22 NOTE — Patient Instructions (Signed)
 It was wonderful to see you today.  Please bring ALL of your medications with you to every visit.   Today we talked about:  - We refilled your Robaxin  and ibuprofen , and placed a referral to physical therapy. I also gave you a note for work ,please have them fax us  any other paperwork needed to [phone]  Thank you for choosing [facility].   Please call [phone] with any questions about today's appointment.  Please arrive at least 15 minutes prior to your scheduled appointments.   If you had blood work today, I will send you a MyChart message or a letter if results are normal. Otherwise, I will give you a call.   If you had a referral placed, they will call you to set up an appointment. Please give us  a call if you don't hear back in the next 2 weeks.   If you need additional refills before your next appointment, please call your pharmacy first.  Don't forget to check out the [facility] in the [facility] Center at [address] [phone] Affordable prices on prescriptions and over-the-counter items, as well as services like vaccinations and medication home delivery.   Rollene Keeling, MD  Family Medicine
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
## 2024-01-01 ENCOUNTER — Ambulatory Visit
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
## 2024-01-02 ENCOUNTER — Ambulatory Visit

## 2024-01-02 ENCOUNTER — Other Ambulatory Visit: Payer: Self-pay

## 2024-01-02 ENCOUNTER — Ambulatory Visit: Attending: Family Medicine

## 2024-01-02 DIAGNOSIS — S29012A Strain of muscle and tendon of back wall of thorax, initial encounter: Secondary | ICD-10-CM | POA: Insufficient documentation

## 2024-01-02 DIAGNOSIS — M546 Pain in thoracic spine: Secondary | ICD-10-CM | POA: Insufficient documentation

## 2024-01-02 DIAGNOSIS — R293 Abnormal posture: Secondary | ICD-10-CM | POA: Diagnosis present

## 2024-01-02 DIAGNOSIS — M542 Cervicalgia: Secondary | ICD-10-CM | POA: Diagnosis present
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
## 2024-01-12 ENCOUNTER — Ambulatory Visit

## 2024-01-12 ENCOUNTER — Telehealth: Payer: Self-pay
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
## 2024-01-12 NOTE — Telephone Encounter (Signed)
 TC due to missed visit.  Spoke directly to patient who had wrong ay listed.  Informed her no additional visits scheduled and need to contact front office to schedule additional sessions.

## 2024-03-04 ENCOUNTER — Ambulatory Visit

## 2024-03-04 DIAGNOSIS — Z30019 Encounter for initial prescription of contraceptives, unspecified: Secondary | ICD-10-CM

## 2024-03-04 MED ORDER — MEDROXYPROGESTERONE ACETATE 150 MG/ML IM SUSP
150.0000 mg | Freq: Once | INTRAMUSCULAR | Status: AC
  Administered 2024-03-04: 150 mg via INTRAMUSCULAR

## 2024-03-04 NOTE — Progress Notes (Signed)
 Patient here today for Depo Provera  injection and is within her dates.     Last contraceptive appt was 11/03/2023.   Depo given in [facility] today. Site unremarkable & patient tolerated injection.     Next injection due 05/20/2024-06/03/2024.  Reminder card given.
